# Patient Record
Sex: Female | Born: 1960 | Race: White | Hispanic: No | Marital: Married | Smoking: Current every day smoker
Health system: Southern US, Community
[De-identification: ages and names within clinical notes are randomized; demographics above are authoritative.]

## PROBLEM LIST (undated history)

## (undated) DIAGNOSIS — K219 Gastro-esophageal reflux disease without esophagitis: Secondary | ICD-10-CM

## (undated) DIAGNOSIS — M1990 Unspecified osteoarthritis, unspecified site: Secondary | ICD-10-CM

## (undated) DIAGNOSIS — F419 Anxiety disorder, unspecified: Secondary | ICD-10-CM

## (undated) DIAGNOSIS — C801 Malignant (primary) neoplasm, unspecified: Secondary | ICD-10-CM

## (undated) DIAGNOSIS — M549 Dorsalgia, unspecified: Secondary | ICD-10-CM

## (undated) DIAGNOSIS — F32A Depression, unspecified: Secondary | ICD-10-CM

## (undated) DIAGNOSIS — G8929 Other chronic pain: Secondary | ICD-10-CM

## (undated) DIAGNOSIS — R0600 Dyspnea, unspecified: Secondary | ICD-10-CM

## (undated) DIAGNOSIS — F329 Major depressive disorder, single episode, unspecified: Secondary | ICD-10-CM

## (undated) DIAGNOSIS — J449 Chronic obstructive pulmonary disease, unspecified: Secondary | ICD-10-CM

## (undated) DIAGNOSIS — F1110 Opioid abuse, uncomplicated: Secondary | ICD-10-CM

## (undated) HISTORY — PX: OTHER SURGICAL HISTORY: SHX169

## (undated) HISTORY — PX: OVARIAN CYST REMOVAL: SHX89

## (undated) HISTORY — PX: ABDOMINAL HYSTERECTOMY: SHX81

## (undated) HISTORY — PX: CHOLECYSTECTOMY: SHX55

## (undated) HISTORY — PX: CARPAL TUNNEL RELEASE: SHX101

## (undated) HISTORY — PX: CYST REMOVAL HAND: SHX6279

---

## 2001-11-20 ENCOUNTER — Other Ambulatory Visit: Admission: RE | Admit: 2001-11-20 | Discharge: 2001-11-20 | Payer: Self-pay | Admitting: Obstetrics and Gynecology

## 2003-01-28 ENCOUNTER — Encounter: Payer: Self-pay | Admitting: Internal Medicine

## 2003-01-28 ENCOUNTER — Encounter (HOSPITAL_COMMUNITY): Admission: RE | Admit: 2003-01-28 | Discharge: 2003-02-27 | Payer: Self-pay | Admitting: Internal Medicine

## 2004-10-22 ENCOUNTER — Ambulatory Visit: Payer: Self-pay | Admitting: Family Medicine

## 2005-03-12 ENCOUNTER — Ambulatory Visit: Payer: Self-pay | Admitting: Family Medicine

## 2005-07-02 ENCOUNTER — Ambulatory Visit: Payer: Self-pay | Admitting: Family Medicine

## 2005-07-23 ENCOUNTER — Ambulatory Visit: Payer: Self-pay | Admitting: Family Medicine

## 2005-10-28 ENCOUNTER — Ambulatory Visit: Payer: Self-pay | Admitting: Family Medicine

## 2005-11-04 ENCOUNTER — Ambulatory Visit: Payer: Self-pay | Admitting: Family Medicine

## 2006-02-24 ENCOUNTER — Ambulatory Visit: Payer: Self-pay | Admitting: Family Medicine

## 2006-03-19 ENCOUNTER — Ambulatory Visit: Payer: Self-pay | Admitting: Family Medicine

## 2006-07-11 ENCOUNTER — Ambulatory Visit (HOSPITAL_COMMUNITY): Admission: RE | Admit: 2006-07-11 | Discharge: 2006-07-11 | Payer: Self-pay | Admitting: Obstetrics and Gynecology

## 2006-07-23 ENCOUNTER — Inpatient Hospital Stay (HOSPITAL_COMMUNITY): Admission: AD | Admit: 2006-07-23 | Discharge: 2006-07-23 | Payer: Self-pay | Admitting: Obstetrics and Gynecology

## 2006-08-07 ENCOUNTER — Ambulatory Visit (HOSPITAL_COMMUNITY): Admission: RE | Admit: 2006-08-07 | Discharge: 2006-08-08 | Payer: Self-pay | Admitting: Obstetrics and Gynecology

## 2006-08-07 ENCOUNTER — Encounter (INDEPENDENT_AMBULATORY_CARE_PROVIDER_SITE_OTHER): Payer: Self-pay | Admitting: *Deleted

## 2009-08-17 ENCOUNTER — Ambulatory Visit: Payer: Self-pay | Admitting: Emergency Medicine

## 2009-08-17 DIAGNOSIS — J449 Chronic obstructive pulmonary disease, unspecified: Secondary | ICD-10-CM | POA: Insufficient documentation

## 2009-08-17 DIAGNOSIS — I10 Essential (primary) hypertension: Secondary | ICD-10-CM

## 2009-08-17 DIAGNOSIS — R05 Cough: Secondary | ICD-10-CM

## 2009-08-17 DIAGNOSIS — J309 Allergic rhinitis, unspecified: Secondary | ICD-10-CM | POA: Insufficient documentation

## 2009-09-04 ENCOUNTER — Telehealth (INDEPENDENT_AMBULATORY_CARE_PROVIDER_SITE_OTHER): Payer: Self-pay | Admitting: *Deleted

## 2009-09-07 ENCOUNTER — Ambulatory Visit: Payer: Self-pay | Admitting: Emergency Medicine

## 2009-10-10 ENCOUNTER — Ambulatory Visit: Payer: Self-pay | Admitting: Emergency Medicine

## 2009-10-11 ENCOUNTER — Telehealth (INDEPENDENT_AMBULATORY_CARE_PROVIDER_SITE_OTHER): Payer: Self-pay | Admitting: *Deleted

## 2009-10-11 ENCOUNTER — Telehealth: Payer: Self-pay | Admitting: Emergency Medicine

## 2009-11-07 ENCOUNTER — Telehealth: Payer: Self-pay | Admitting: Emergency Medicine

## 2010-07-17 NOTE — Assessment & Plan Note (Signed)
Summary: COPD, cough   Visit Type:  Follow-up Copy to:  Prudy Feeler, PA Primary Provider/Referring Provider:  Samuel Jester, DO  CC:  PFT follow up and pt had no complaints today.  History of Present Illness: 50 yo smoker, hx allergic rhinitis and HTN. Referred for COPD. Has been on Advair for 2 -3 yrs for chronic bronchitis per her PCP. Has had flares before, has been treated with pred and abx before. CXR with hyperinflation.   Developed URI symptoms a month, cough prod of greeninsh phlegm, coughs all day, worse at night. Has been rx with Prednisone, amox, doxycycline. Some pleuritic CP, occas wheeze. Still with nasal congestion. Using ventolin frequently.   ROV 09/07/09 -- returns for f/u. Saw her last time for COPD (on Advair), chronic cough. Continues to cough frequently, especially at night. Productive of yellowish phlegm. She is doing nasal saline washes. Tried benadryl temporarily. On nexium with very little breakthrough GERD. Still smoking 1 pk/day.   ROV 10/10/09 -- follows up for her tobacco use, COPD, chronic cough. She stopped lisinopril for 10 days, but restarted because her BP was poorly controlled on the benicar. Also continued the [place] and fluticisone spray. On Nexium once daily. Has been on Advair.   Current Medications (verified): 1)  Lisinopril 10 Mg Tabs (Lisinopril) .Marland Kitchen.. 1 Once Daily 2)  Advair Diskus 250-50 Mcg/dose Aepb (Fluticasone-Salmeterol) .Marland Kitchen.. 1 Two Times A Day 3)  Proventil Hfa 108 (90 Base) Mcg/act Aers (Albuterol Sulfate) .... 2 Puffs Every 4 Hrs As Needed 4)  Eemt 1.25-2.5 Mg Tabs (Est Estrogens-Methyltest) .Marland Kitchen.. 1 Once Daily 5)  Hydrocodone-Acetaminophen 10-325 Mg Tabs (Hydrocodone-Acetaminophen) .Marland Kitchen.. 1 Every 6 Hrs As Needed 6)  Carisoprodol 350 Mg Tabs (Carisoprodol) .Marland Kitchen.. 1 Every 6 Hrs As Needed 7)  Nexium 40 Mg Cpdr (Esomeprazole Magnesium) .Marland Kitchen.. 1 Once Daily 8)  Tussionex Pennkinetic Er 8-10 Mg/47ml Lqcr (Chlorpheniramine-Hydrocodone) .Marland Kitchen.. 1 Tsp Every 12  Hours As Needed For Cough 9)  Loratadine 10 Mg Tabs (Loratadine) .Marland Kitchen.. 1 By Mouth Once Daily 10)  Fluticasone Propionate 50 Mcg/act Susp (Fluticasone Propionate) .... 2 Sprays Each Nostril Two Times A Day  Allergies (verified): No Known Drug Allergies  Vital Signs:  Patient profile:   50 year old female Height:      65 inches (165.10 cm) Weight:      141 pounds (64.09 kg) BMI:     23.55 O2 Sat:      96 % on Room air Temp:     97.7 degrees F (36.50 degrees C) oral Pulse rate:   79 / minute BP sitting:   108 / 62  (right arm) Cuff size:   regular  Vitals Entered By: Carver Fila SMA (October 10, 2009 3:52 PM)  O2 Flow:  Room air CC: PFT follow up, pt had no complaints today Comments Meds and allergies updated   Physical Exam  General:  well developed, well nourished, nasal voice with significant congestion Head:  normocephalic and atraumatic Eyes:  conjunctiva and sclera clear Nose:  no deformity, erythema + clear drainage Mouth:  post pharynx clear Neck:  no masses, thyromegaly, or abnormal cervical nodes, no stridor Chest Wall:  no deformities noted Lungs:  clear bilaterally to auscultation and percussion, no wheeze on forced exp Heart:  regular rate and rhythm, S1, S2 without murmurs, rubs, gallops, or clicks Abdomen:  not examined Msk:  no deformity or scoliosis noted with normal posture Extremities:  no clubbing, cyanosis, edema, or deformity noted Neurologic:  non-focal Skin:  intact without  lesions or rashes Psych:  alert and cooperative; normal mood and affect; normal attention span and concentration   Pulmonary Function Test Date: 10/10/2009 Height (in.): 65 Gender: Female  Pre-Spirometry FVC    Value: 3.83 L/min   Pred: 3.47 L/min     % Pred: 110 % FEV1    Value: 2.23 L     Pred: 2.65 L     % Pred: 84 % FEV1/FVC  Value: 76 %     Pred: 58 %    FEF 25-75  Value: 0.91 L/min   Pred: 3.01 L/min     % Pred: 30 %  Post-Spirometry FVC    Value: 3.78 L/min   Pred:  3.47 L/min     % Pred: 109 % FEV1    Value: 2.33 L     Pred: 2.65 L     % Pred: 88 % FEV1/FVC  Value: 62 %     Pred: 76 %    FEF 25-75  Value: 1.03 L/min   Pred: 3.01 L/min     % Pred: 34 %  Lung Volumes TLC    Value: 5.49 L   % Pred: 106 % RV    Value: 1.66 L   % Pred: 91 % DLCO    Value: 10.0 %   % Pred: 47 % DLCO/VA  Value: 2.12 %   % Pred: 53 %  Comments: mild AFL, no BD response. normal volumes. decreased diffusion capacity. RSB  Impression & Recommendations:  Problem # 1:  COPD (ICD-496) - spiriva - d./c advair  Problem # 2:  COUGH (ICD-786.2)  Treating allergies, GERD - try stopping lisinopril again, start higher dose ARB (losartan 50 two times a day) - try changing advair to spiriva - tussionex - smoking cessation  Orders: Est. Patient Level IV ([address]) Prescription Created Electronically [phone])  Medications Added to Medication List This Visit: 1)  Losartan Potassium 50 Mg Tabs (Losartan potassium) .Marland Kitchen.. 1 by mouth two times a day 2)  Spiriva Handihaler 18 Mcg Caps (Tiotropium bromide monohydrate) .Marland Kitchen.. 1 inhalation once daily 3)  Tussionex Pennkinetic Er 8-10 Mg/28ml Lqcr (Chlorpheniramine-hydrocodone) .... 5cc by mouth q12h as needed cough  Patient Instructions: 1)  Continue your nasal saline washes once daily  2)  Stop lisinopril again 3)  Start losartan 50mg  by mouth two times a day  4)  You need to stop smoking! 5)  Continue your nexium 6)  Continue your fluticisone nasal spray 7)  Use your proventil as needed.  8)  Stop Advair 9)  Start Spiriva 1 inhalation once daily  10)  Follow up with Dr Delton Coombes in 1 month or as needed.  Prescriptions: Sandria Senter ER 8-10 MG/5ML LQCR (CHLORPHENIRAMINE-HYDROCODONE) 5cc by mouth q12h as needed cough  #8 oz x 0   Entered and Authorized by:   Leslye Peer MD   Signed by:   Leslye Peer MD on 10/10/2009   Method used:   Print then Give to Patient   RxID:   [id number] SPIRIVA HANDIHALER 18 MCG CAPS  (TIOTROPIUM BROMIDE MONOHYDRATE) 1 inhalation once daily  #30 x 4   Entered and Authorized by:   Leslye Peer MD   Signed by:   Leslye Peer MD on 10/10/2009   Method used:   Electronically to        [facility] [address] [facility] (retail)       [address]       [place], [place]  [address]       Ph: [phone]       Fax: [phone]   RxID:   [id number] WUXLKGMW POTASSIUM 50 MG TABS (LOSARTAN POTASSIUM) 1 by mouth two times a day  #60 x 4   Entered and Authorized by:   Leslye Peer MD   Signed by:   Leslye Peer MD on 10/10/2009   Method used:   Electronically to        [facility] [address] [facility] (retail)       [address]       [place], [place]  [address]       Ph: [phone]       Fax: [phone]   RxID:   [id number]

## 2010-07-17 NOTE — Progress Notes (Signed)
Summary: prescript  Phone Note From Pharmacy Call back at [phone]   Caller: [facility]  [address] [address] Call For: byrum  Summary of Call: questiona about tussinex prescript  Pharmacy called again pt there Melanie Santiago  October 11, 2009 11:55 AM  Initial call taken by: Rickard Patience,  October 11, 2009 11:46 AM  Follow-up for Phone Call        called pharmacy and spoke to [name] and she states she was nto the pharmacists on this AM adn I need to speak to Florentina Addison who is on Lunch. Sh easked me to call back in 45 minutes. Carron Curie CMA  October 11, 2009 1:04 PM   Additional Follow-up for Phone Call Additional follow up Details #1::        Called spoke with pharmacist Katie.  She states that pt had last filled tussionex on 4/20 for a 12 day supply.  Pharmacist states that pt is requesting early refill.  She states that the earliest they can fill this would be the 30th and this is 2 days early.  Pt states that RB told her that he would be fine with this.  Katie wants to make you aware that she has had problems in the past with pt as far as getting narcs filled at different pharmacies and also trying to get refills early alot.  She gets percocet monthly like clock work per pharmacist.  Pls advise if okay to refill med early, thanks Additional Follow-up by: Vernie Murders,  October 11, 2009 2:14 PM    Additional Follow-up for Phone Call Additional follow up Details #2::    Please call and let her know that I wasn't aware that we were filling early. I don't want her to get the med early, she should wait until the refill was originally planned on the 30th. Leslye Peer MD  October 11, 2009 2:17 PM   Dr Delton Coombes, the 30th will still be 2 days early.  The 12 day supply ends on the 1st of May so really she the 2nd is the day for her to have filled without being early.  Pls advise, thanks Vernie Murders  October 11, 2009 2:22 PM  Then we should do May 2 instead. I don't want to fill early Leslye Peer MD   October 11, 2009 2:43 PM       Additional Follow-up for Phone Call Additional follow up Details #3:: Details for Additional Follow-up Action Taken: Spoke with Florentina Addison and made aware that pt can not med filled until it is due on 5/2.  Also spoke with pt's spouse and explained this to him.  Spouse verbalized understanding. Additional Follow-up by: Vernie Murders,  October 11, 2009 2:48 PM

## 2010-07-17 NOTE — Progress Notes (Signed)
Summary: rx  Phone Note Call from Patient Call back at [phone]   Caller: Patient Call For: Shiri Hodapp Reason for Call: Refill Medication, Talk to Nurse Summary of Call: can she get a refill on her cough med.  Going out of town Friday. Initial call taken by: Eugene Gavia,  Nov 07, 2009 2:15 PM  Follow-up for Phone Call        Spoke with pt.  She is requesting a refill on tussionex because she is going out of town this wkend and will be gone for 1 wk.  She states that the cough is better, but still bothers her.  She states that cough is prod with green sputum.  She is still smoking but trying to quit. She had tussionex refilled on 10/16/09.  Pt aware RB not back in the office until 5/25 am.  Please advise, thanks! Follow-up by: Vernie Murders,  Nov 07, 2009 2:55 PM  Additional Follow-up for Phone Call Additional follow up Details #1::        It is too early to refill the tussionex, likely should not continue indefinitely anyway especially since she is still smoking. I would like for her try delsym 10cc q12h as needed for cough.  Additional Follow-up by: Leslye Peer MD,  Nov 08, 2009 8:56 AM    Additional Follow-up for Phone Call Additional follow up Details #2::    Pt advised. Carron Curie CMA  Nov 08, 2009 9:26 AM

## 2010-07-17 NOTE — Assessment & Plan Note (Signed)
Summary: URI, COPD, cough   Visit Type:  Initial Consult Primary Provider/Referring Provider:  Samuel Jester, DO  CC:   Pulmonary Consult c/o prod cough green increase sob  and unable to sleep x 3 weeks pt is on third antibiotic.  History of Present Illness: 50 yo smoker, hx allergic rhinitis and HTN. Referred for COPD. Has been on Advair for 2 -3 yrs for chronic bronchitis per her PCP. Has had flares before, has been treated with pred and abx before. CXR with hyperinflation.   Developed URI symptoms a month, cough prod of greeninsh phlegm, coughs all day, worse at night. Has been rx with Prednisone, amox, doxycycline. Some pleuritic CP, occas wheeze. Still with nasal congestion. Using ventolin frequently.   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  Vital Signs:  Patient profile:   50 year old female Height:      65 inches Weight:      131.8 pounds BMI:     22.01 O2 Sat:      96 % on Room air Temp:     98.1 degrees F oral Pulse rate:   85 / minute BP sitting:   140 / 84  (left arm)  Vitals Entered By: Renold Genta RCP, LPN (August 17, 1608 10:35 AM)  O2 Sat at Rest %:  96& O2 Flow:  Room air CC:  Pulmonary Consult c/o prod cough green increase sob , unable to sleep x 3 weeks pt is on third antibiotic Comments Medications reviewed with patient Renold Genta RCP, LPN  August 18, 9602 10:57 AM    Physical Exam  General:  well developed, well nourished, nasal voice with significant congestion Head:  normocephalic and atraumatic Eyes:  conjunctiva and sclera clear Nose:  no deformity, erythema + clear drainage Mouth:  post pharynx clear Neck:  no masses, thyromegaly, or abnormal cervical nodes, no  stridor Chest Wall:  no deformities noted Lungs:  clear bilaterally to auscultation and percussion, no wheeze on forced exp Heart:  regular rate and rhythm, S1, S2 without murmurs, rubs, gallops, or clicks Abdomen:  not examined Msk:  no deformity or scoliosis noted with normal posture Extremities:  no clubbing, cyanosis, edema, or deformity noted Neurologic:  non-focal Skin:  intact without lesions or rashes Psych:  alert and cooperative; normal mood and affect; normal attention span and concentration   Impression & Recommendations:  Problem # 1:  COUGH (ICD-786.2)  Suspect this is due to persistant PND, either because her viral process has not cleared +/- allergies. Slow clearance may also be due to her underlying COPD and continued smoking 1 pk/day, although her acute exacerbation (if she had one) has cleared. Need to treat symptoms, manage PND to control cough - start NSWs every day - OTC decongestants - restart allergy regimen: she wants something for sleep, so will have he ruse benadry at bedtime, should help with both issues - tussionex as needed  - solidify allergy regimen in the future after this episode resolves  Orders: Consultation Level IV ([address])  Problem # 2:  COPD (ICD-496) Almost certainly has underlying COPD, not sure there was an exacerbation. If so, it has been successfully treated with Pred + abx.  - continue Advair for now, may decide to adjust BDs in the  future - full PFT when this illness resolves - will check a-1 AT level at a future visit (age 69)  Medications Added to Medication List This Visit: 1)  Lisinopril 10 Mg Tabs (Lisinopril) .Marland Kitchen.. 1 once daily 2)  Advair Diskus 250-50 Mcg/dose Aepb (Fluticasone-salmeterol) .Marland Kitchen.. 1 two times a day 3)  Proventil Hfa 108 (90 Base) Mcg/act Aers (Albuterol sulfate) .... 2 puffs every 4 hrs as needed 4)  Eemt 1.25-2.5 Mg Tabs (Est estrogens-methyltest) .Marland Kitchen.. 1 once daily 5)  Hydrocodone-acetaminophen 10-325 Mg Tabs  (Hydrocodone-acetaminophen) .Marland Kitchen.. 1 every 6 hrs as needed 6)  Doxycycline Hyclate 100 Mg Tabs (Doxycycline hyclate) .Marland Kitchen.. 1 two times a day 7)  Carisoprodol 350 Mg Tabs (Carisoprodol) .Marland Kitchen.. 1 every 6 hrs as needed 8)  Promethazine Vc/codeine 6.25-5-10 Mg/4ml Syrp (Phenyleph-promethazine-cod) .Marland Kitchen.. 1 tsp every 6 hrs as needed 9)  Nexium 40 Mg Cpdr (Esomeprazole magnesium) .Marland Kitchen.. 1 once daily 10)  Tussionex Pennkinetic Er 8-10 Mg/49ml Lqcr (Chlorpheniramine-hydrocodone) .... 5cc by mouth q12h as needed for cough  Patient Instructions: 1)  Start doing your nasal saline washes every day until our next visit. 2)  Use over-the-counter decongestants that contain either chlorphenerimine or bromphenerimine as directed. 3)  Take benadryl 25mg  by mouth at bedtime  4)  Stop your current cough medication and use tussionex up to every 12 hours if needed for cough 5)  Continue your Advair two times a day 6)  Use Ventolin 2 puffs up to every 4 hours if needed for shortness of breath. 7)  Follow up with Dr Delton Coombes in 2 -3 weeks to review your progress.  Prescriptions: Sandria Senter ER 8-10 MG/5ML LQCR (CHLORPHENIRAMINE-HYDROCODONE) 5cc by mouth q12h as needed for cough  #4 oz x 0   Entered and Authorized by:   Leslye Peer MD   Signed by:   Leslye Peer MD on 08/17/2009   Method used:   Print then Give to Patient   RxID:   [id number]

## 2010-07-17 NOTE — Progress Notes (Signed)
Summary: rx  Phone Note Call from Patient Call back at [phone]   Caller: Patient Call For: Karmelo Bass Reason for Call: Talk to Nurse Summary of Call: pharmacy won't fill cough syrup due to wording...do you want to re-write? Initial call taken by: Eugene Gavia,  October 11, 2009 9:21 AM  Follow-up for Phone Call        spoke with pt and she states she took rx to pharmacy and they stated it was too early to fill the rx because she just had a 4oz bottle filled last month and they are telling her this refill is too early per insurance. She states she has the rx in hand and is going to take it to [facility]. I advised her to tell them to call us if they have a problem filling the med. Carron Curie CMA  October 11, 2009 9:37 AM

## 2010-07-17 NOTE — Miscellaneous (Signed)
Summary: Orders Update pft charges  Clinical Lists Changes  Orders: Added new Service order of Carbon Monoxide diffusing w/capacity (94720) - Signed Added new Service order of Lung Volumes (94240) - Signed Added new Service order of Spirometry (Pre & Post) (94060) - Signed 

## 2010-07-17 NOTE — Progress Notes (Signed)
Summary: cough med  Phone Note Call from Patient   Caller: Spouse Call For: byrum Summary of Call: calling for cough med need called to pharmacy [facility] Initial call taken by: Rickard Patience,  October 11, 2009 2:12 PM  Follow-up for Phone Call        Called and spoke with pt's spouse and made aware that we already received a call from pharmacist regarding this issue and we are awaiting response from RB.  Spouse verbalized understanding. Follow-up by: Vernie Murders,  October 11, 2009 2:19 PM

## 2010-07-17 NOTE — Assessment & Plan Note (Signed)
Summary: COPD, cough   Visit Type:  Follow-up Copy to:  Prudy Feeler, PA Primary Provider/Referring Provider:  Samuel Jester, DO  CC:  COPD and cough follow-up.  the patient c/o cough with yellow mucus but says it is better. Still having sob.Marland Kitchen  History of Present Illness: 50 yo smoker, hx allergic rhinitis and HTN. Referred for COPD. Has been on Advair for 2 -3 yrs for chronic bronchitis per her PCP. Has had flares before, has been treated with pred and abx before. CXR with hyperinflation.   Developed URI symptoms a month, cough prod of greeninsh phlegm, coughs all day, worse at night. Has been rx with Prednisone, amox, doxycycline. Some pleuritic CP, occas wheeze. Still with nasal congestion. Using ventolin frequently.   ROV 09/07/09 -- returns for f/u. Saw her last time for COPD (on Advair), chronic cough. Continues to cough frequently, especially at night. Productive of yellowish phlegm. She is doing nasal saline washes. Tried benadryl temporarily. On nexium with very little breakthrough GERD. Still smoking 1 pk/day.   Current Medications (verified): 1)  Lisinopril 10 Mg Tabs (Lisinopril) .Marland Kitchen.. 1 Once Daily 2)  Advair Diskus 250-50 Mcg/dose Aepb (Fluticasone-Salmeterol) .Marland Kitchen.. 1 Two Times A Day 3)  Proventil Hfa 108 (90 Base) Mcg/act Aers (Albuterol Sulfate) .... 2 Puffs Every 4 Hrs As Needed 4)  Eemt 1.25-2.5 Mg Tabs (Est Estrogens-Methyltest) .Marland Kitchen.. 1 Once Daily 5)  Hydrocodone-Acetaminophen 10-325 Mg Tabs (Hydrocodone-Acetaminophen) .Marland Kitchen.. 1 Every 6 Hrs As Needed 6)  Carisoprodol 350 Mg Tabs (Carisoprodol) .Marland Kitchen.. 1 Every 6 Hrs As Needed 7)  Nexium 40 Mg Cpdr (Esomeprazole Magnesium) .Marland Kitchen.. 1 Once Daily 8)  Tussionex Pennkinetic Er 8-10 Mg/68ml Lqcr (Chlorpheniramine-Hydrocodone) .Marland Kitchen.. 1 Tsp Every 12 Hours As Needed For Cough  Allergies (verified): No Known Drug Allergies  Vital Signs:  Patient profile:   50 year old female Height:      65 inches (165.10 cm) Weight:      136 pounds (61.82  kg) BMI:     22.71 O2 Sat:      98 % on Room air Temp:     98.0 degrees F (36.67 degrees C) oral Pulse rate:   77 / minute BP sitting:   116 / 78  (right arm) Cuff size:   regular  Vitals Entered By: Michel Bickers CMA (September 07, 2009 1:25 PM)  O2 Sat at Rest %:  98 O2 Flow:  Room air CC: COPD and cough follow-up.  the patient c/o cough with yellow mucus but says it is better. Still having sob. Comments Medications reviewed with the patient.Michel Bickers CMA  September 07, 2009 1:36 PM   Physical Exam  General:  well developed, well nourished, nasal voice with significant congestion Head:  normocephalic and atraumatic Eyes:  conjunctiva and sclera clear Nose:  no deformity, erythema + clear drainage Mouth:  post pharynx clear Neck:  no masses, thyromegaly, or abnormal cervical nodes, no stridor Chest Wall:  no deformities noted Lungs:  clear bilaterally to auscultation and percussion, no wheeze on forced exp Heart:  regular rate and rhythm, S1, S2 without murmurs, rubs, gallops, or clicks Abdomen:  not examined Msk:  no deformity or scoliosis noted with normal posture Extremities:  no clubbing, cyanosis, edema, or deformity noted Neurologic:  non-focal Skin:  intact without lesions or rashes Psych:  alert and cooperative; normal mood and affect; normal attention span and concentration   Impression & Recommendations:  Problem # 1:  COUGH (ICD-786.2)  Continue your nasal saline washes once daily  Continue your Nexium once daily  Start nasonex 2 sprays each nostril once daily. Once this sample runs out, you have a prescription for fluticisone 2 sprays each nostril two times a day  Stop lisinopril temporarily Start Benicar 20mg  by mouth once daily until the samples run out. If they run out before your follow up visit then go back on your lisinopril.  You may use OTC decongestants that contain the ingredients chlorphenerimine or bromphenerimine.  Follow up with Dr Delton Coombes in 1 month with  full PFT's same day  Orders: Est. Patient Level IV ([address])  Problem # 2:  COPD (ICD-496) Continue your Advair two times a day  Follow up with Dr Delton Coombes in 1 month with full PFT's same day  Medications Added to Medication List This Visit: 1)  Loratadine 10 Mg Tabs (Loratadine) .Marland Kitchen.. 1 by mouth once daily 2)  Fluticasone Propionate 50 Mcg/act Susp (Fluticasone propionate) .... 2 sprays each nostril two times a day 3)  Tussionex Pennkinetic Er 8-10 Mg/74ml Lqcr (Chlorpheniramine-hydrocodone) .... 5cc by mouth q12h as needed cough  Patient Instructions: 1)  Continue your nasal saline washes once daily  2)  Continue your Advair two times a day  3)  Continue your Nexium once daily  4)  Start nasonex 2 sprays each nostril once daily. Once this sample runs out, you have a prescription for fluticisone 2 sprays each nostril two times a day  5)  Stop lisinopril temporarily 6)  Start Benicar 20mg  by mouth once daily until the samples run out. If they run out before your follow up visit then go back on your lisinopril.  7)  You may use OTC decongestants that contain the ingredients chlorphenerimine or bromphenerimine.  8)  Follow up with Dr Delton Coombes in 1 month with full PFT's same day Prescriptions: TUSSIONEX PENNKINETIC ER 8-10 MG/5ML LQCR (CHLORPHENIRAMINE-HYDROCODONE) 5cc by mouth q12h as needed cough  #4 oz x 0   Entered and Authorized by:   Leslye Peer MD   Signed by:   Leslye Peer MD on 09/07/2009   Method used:   Print then Give to Patient   RxID:   [id number] FLUTICASONE PROPIONATE 50 MCG/ACT SUSP (FLUTICASONE PROPIONATE) 2 sprays each nostril two times a day  #1 x 5   Entered and Authorized by:   Leslye Peer MD   Signed by:   Leslye Peer MD on 09/07/2009   Method used:   Electronically to        [facility] [address] [facility] ([facility]ail)       [address]       [place], [place]  [address]       Ph: [phone]       Fax: [phone]   RxID:    [id number] LORATADINE 10 MG TABS (LORATADINE) 1 by mouth once daily  #30 x 5   Entered and Authorized by:   Leslye Peer MD   Signed by:   Leslye Peer MD on 09/07/2009   Method used:   Electronically to        [facility] [address] [facility] ([facility]ail)       [address]       [place], [place]  [address]       Ph: [phone]       Fax: [phone]   RxID:   [id number]

## 2010-07-17 NOTE — Progress Notes (Signed)
Summary: refill cough syrup requested for cough  Phone Note Call from Patient   Caller: Spouse Call For: byrum Summary of Call: pt's spouse says pt is still coughing and request refill of tussionex. call lee Borgen at [phone] (says pt is at work) [employer] Initial call taken by: Tivis Ringer, CNA,  September 04, 2009 10:39 AM  Follow-up for Phone Call        Pt's spouse states the pt was up all night coughing. She is still doing the nasal rinses daily and is scheduled for f/u with RB on Thurs. 09/07/2009. RB is out of the office today. MR is doc of the morning and will send to him for okay to refill Tussinex.  Pharmacy is [facility] in [place]. 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 Do they want to be seen urgentlytoday? I would recommend that first. If not, ok for 2 days of tussionex till they see RB  Additional Follow-up by: Kalman Shan MD,  September 04, 2009 1:00 PM    Additional Follow-up for Phone Call Additional follow up Details #2::    Spoke with pt's spouse Nedra Hai.  He states that pt is unable to come in for appt today because she is working.  Advised that we will call in enough tussionex to last until her ov with RB on 3/24.  [facility] [place].  New/Updated Medications: TUSSIONEX PENNKINETIC ER 8-10 MG/5ML LQCR (CHLORPHENIRAMINE-HYDROCODONE) 1 tsp every 12 hours as needed for cough Prescriptions: TUSSIONEX PENNKINETIC ER 8-10 MG/5ML LQCR (CHLORPHENIRAMINE-HYDROCODONE) 1 tsp every 12 hours as needed for cough  #qs x 0   Entered by:   Vernie Murders   Authorized by:   Kalman Shan MD   Signed by:   Vernie Murders on 09/04/2009   Method used:   Telephoned to ...       [facility] [facility] (retail)       [address]       [place], [place]  [address]       Ph: [phone]       Fax: [phone]   RxID:    [id number]

## 2010-11-02 NOTE — Discharge Summary (Signed)
NAME:  Melanie Santiago, Melanie Santiago                ACCOUNT NO.:  [id number]   MEDICAL RECORD NO.:  [id number]          PATIENT TYPE:  OIB   LOCATION:  9304                          [facility]ILITY:  [facility]   PHYSICIAN:  Duke Salvia. Marcelle Overlie, M.D.DATE OF BIRTH:  1961-04-23   DATE OF ADMISSION:  08/07/2006  DATE OF DISCHARGE:  08/08/2006                               DISCHARGE SUMMARY   DISCHARGE DIAGNOSES:  1. Chronic pelvic pain.  2. Ovarian cyst.  3. Leiomyoma.  4. Laparoscopically assisted vaginal hysterectomy/bilateral salpingo-      oophorectomy this admission.   SUMMARY OF HISTORY AND PHYSICAL EXAMINATION:  Please see admission H&P  for details.  Briefly, a 50 year old with known leiomyoma and chronic  pelvic pain presents for definitive hysterectomy and BSO.   HOSPITAL COURSE:  On February 21, under general anesthesia, the patient  underwent LAVH/BSO.  There were some smooth-walled bilateral ovarian  cysts with bilateral adnexal adhesions and a symmetrically enlarged  uterus.  She underwent LAVH/BSO without complication.  On the 1st postop  day, she was voiding without difficulty.  Her catheter been removed the  evening before.  She was afebrile, tolerating a regular diet.  Her  abdominal exam was unremarkable.  The incisions were clean and dry and  she was ready for discharge at that point.   LABORATORY DATA:  Blood type is A positive, antibody screen negative.  CMET on admission normal except for alkaline phosphatase of 30, slightly  low, and potassium of 3.3.  CBC on admission:  WBC 8300 and hemoglobin  11.9.  On February 22, her potassium had corrected with supplementation  to 3.7, calcium 8.0, total protein 4.9, albumin 2.7.  CBC on February  22:  WBC 7900 and hemoglobin 9.8.   DISPOSITION:  The patient is discharged on:  1. Climara 0.1 patch to change once weekly.  2. Vicodin ES, #30, one p.o. q.4-6 h. p.r.n. pain.   FOLLOWUP:  She will return the office in 1 week.   DISCHARGE  INSTRUCTIONS:  Advised to report any incisional redness or  drainage, increased pain or bleeding, or fever over 101.  She was given  specific instructions regarding diet, sex and exercise.   CONDITION:  Good.   ACTIVITY:  Gradually to increase.     Richard M. Marcelle Overlie, M.D.  Electronically Signed    RMH/MEDQ  D:  08/08/2006  T:  08/08/2006  Job:  [id number]

## 2010-11-02 NOTE — Op Note (Signed)
NAME:  Melanie Santiago, Melanie Santiago                ACCOUNT NO.:  [id number]   MEDICAL RECORD NO.:  [id number]          PATIENT TYPE:  AMB   LOCATION:  SDC                           [facility]   PHYSICIAN:  Duke Salvia. Marcelle Overlie, M.D.DATE OF BIRTH:  Jun 02, 1961   DATE OF PROCEDURE:  08/07/2006  DATE OF DISCHARGE:                               OPERATIVE REPORT   PREOPERATIVE DIAGNOSES:  1. Chronic pelvic pain.  2. Leiomyoma.   POSTOPERATIVE DIAGNOSES:  1. Chronic pelvic pain.  2. Leiomyoma.  3. Bilateral ovarian cysts.   PROCEDURE:  LAVH BSO.   SURGEON:  Duke Salvia. Marcelle Overlie, M.D.   ASSISTANT:  Guy Sandifer. Henderson Cloud, M.D.   SPECIMENS REMOVED:  Uterus, bilateral tubes and ovaries.   ESTIMATED BLOOD LOSS:  250.   COMPLICATIONS:  None.   DRAINS:  Foley catheter.   PROCEDURE AND FINDINGS:  The patient taken to the operating room.  After  an adequate level of general endotracheal anesthesia was obtained with  the patient's legs in stirrups, the abdomen, perineum and vagina were  prepped and draped with Betadine.  The bladder was drained with a Foley  catheter.  EUA carried out.  The uterus was 8-9 weeks' size, mobile.  Adnexa negative.  Hulka tenaculum was positioned and attention directed  to the abdomen where a 2 cm subumbilical incision was made after  infiltrating with half strength Marcaine plain. A small incision was  made and the Veress needle was introduced without difficulty.  After 2.5  liter pneumoperitoneum was then created, laparoscopic trocar and sleeve  were then introduced without difficulty.  There was no evidence any  bleeding or trauma.  Three fingerbreadths above the symphysis in  midline, 5 mm trocar was inserted under direct visualization.  The  patient then placed in Trendelenburg and pelvic findings as follows.   The uterus itself was symmetrically large, approximately 9-10 weeks  size, but mobile.  The cul-de-sac was free and clear.  The right ovary  was enlarged twice  normal size with a smooth-walled cyst occupying one  pole.  There were some periadnexal cysts on the left side, but that left  ovary otherwise appeared to be normal.  Starting on the right, the right  tube and ovary were placed on traction toward the midline.  The  infundibulopelvic ligament was observed.  The course of the ureter was  traced out and noted to be well below.  The right IP ligament was  coagulated and divided with the gyrus PK instrument down to and  including the round ligament.  The exact same repeated on the left side  after carefully identifying again the course of the left ureter which  was well below the operative site.  Once the ovaries were freed up, the  vaginal portion procedure started at that point.   The legs were extended, a weighted speculum was positioned.  Cervix  grasped with tenaculum.  Cervicovaginal mucosa was incised with the  Bovie.  Posterior culdotomy performed without difficulty.  The bladder  was advanced superiorly with sharp and blunt dissection.  Once the  anterior peritoneum  was identified, this was entered sharply and a  retractor used to gently elevate the bladder out of the field.  In  sequential manner, staying close the uterus, the uterosacral ligament,  cardinal ligament, uterine vasculature pedicles and upper broad ligament  pedicles were clamped, divided with the gyrus PK instrument.  The fundus  of the uterus was then delivered posteriorly.  Some remaining small  peritoneal pedicles were clamped, divided and free tied with 0 Vicryl  suture.  The cuff was then closed from 3 to 9 o'clock with a running  locked 2-0 Vicryl suture.  Prior to closure, sponge, needle and  instrument counts were reported as correct x2.  The posterior cul-de-sac  was reinforced by a plicating left uterosacral ligament, picking up  posterior peritoneum superficially to the right the uterosacral ligament  by the cuff and tied down for extra posterior support.   The vaginal  tissue was then closed right-to-left with interrupted 2-0 Monocryl  suture.  Prior to closure, sponge, needle and instrument counts were  reported as correct x2.  Foley catheter was repositioned draining clear  urine.  Repeat laparoscopy with the pressure lowered revealed excellent  hemostasis of the operative site.  Instruments were removed, gas allowed  to escape, defect closed with 4-0 Dexon subcuticular sutures and  Dermabond.  She tolerated this well and went to the recovery room in  good condition.      Richard M. Marcelle Overlie, M.D.  Electronically Signed     RMH/MEDQ  D:  08/07/2006  T:  08/07/2006  Job:  [id number]

## 2010-11-02 NOTE — H&P (Signed)
NAME:  Melanie Santiago, Melanie Santiago                ACCOUNT NO.:  [id number]   MEDICAL RECORD NO.:  [id number]          PATIENT TYPE:  AMB   LOCATION:  SDC                           [facility]   PHYSICIAN:  Duke Salvia. Marcelle Overlie, M.D.DATE OF BIRTH:  01/28/1961   DATE OF ADMISSION:  DATE OF DISCHARGE:                              HISTORY & PHYSICAL   DATE OF SURGERY:  August 07, 2006 at [facility].   CHIEF COMPLAINT:  Chronic pelvic pain, right ovarian cyst.   HISTORY OF PRESENT ILLNESS:  The patient is a 50 year old G2, P1 who has  had a prior tubal.  She has also had a prior Pfannenstiel laparotomy for  right ovarian cystectomy and has been evaluated recently with worsening  pelvic pain,  in particular right lower quadrant pain.  She has  postponed hysterectomy several times, but her pain has worsened to the  point that she is now requiring narcotics for even minimal relief.  Recent CT scan to rule out kidney stone for hematuria showed a 3 cm  right ovarian cyst and small leiomyoma.  She presents now for LAVH, BSO,  possible TAH, BSO.  This procedure including risk of bleeding,  infection, transfusion, adjacent organ injury, along with her expected  recovery time, the need for ERT,  possibly other risks related to her  smoking history and pneumonia, wound infection, phlebitis  were all  reviewed with her, which she understands and accepts.   PAST MEDICAL HISTORY:   ALLERGIES:  None.   PAST SURGICAL HISTORY:  Operations include tubal, cholecystectomy,  laparotomy for right ovarian cystectomy.   SOCIAL HISTORY:  Smoking history one pack per day.   FAMILY HISTORY:  Family history is significant for mother who had  unspecified type of cancer.   CLINICAL DATA:  Her most recent ultrasound showed enlarged uterus with  leiomyomata, some with calcifications, with a right ovarian cyst.   PHYSICAL EXAMINATION:  VITAL SIGNS:  Temperature 98.2, blood pressure  120/72.  HEENT: Unremarkable.  NECK:  Supple without masses.  LUNGS:  Clear.  CARDIOVASCULAR:  Regular rate and rhythm without murmurs, rubs or  gallops noted.  BREASTS:  Without masses.  ABDOMEN:  Soft, flat, nontender.  PELVIC:  Normal external genitalia, Vagina and cervix clear.  Uterus 8  weeks size, slightly tender with fullness on the right side difficult to  delineate due to discomfort.  Left side unremarkable.  EXTREMITIES:  Unremarkable.  NEUROLOGICAL:  Neurologically she is unremarkable.   IMPRESSION:  Acute and chronic pelvic pain, most likely secondary to  leiomyoma with right ovarian cyst and probable right ovarian adhesions  with a prior history of right ovarian cystectomy.   PLAN:  LAVH, BSO, possible TAH,BSO.  Procedure and risks reviewed as  above.      Richard M. Marcelle Overlie, M.D.  Electronically Signed     RMH/MEDQ  D:  07/24/2006  T:  07/24/2006  Job:  [id number]

## 2011-06-24 ENCOUNTER — Other Ambulatory Visit: Payer: Self-pay

## 2011-06-28 ENCOUNTER — Emergency Department (HOSPITAL_COMMUNITY)
Admission: EM | Admit: 2011-06-28 | Discharge: 2011-06-28 | Disposition: A | Discharge status: Home / Self Care | Payer: BC Managed Care – PPO | Attending: Emergency Medicine | Admitting: Emergency Medicine

## 2011-06-28 ENCOUNTER — Emergency Department (HOSPITAL_COMMUNITY): Payer: BC Managed Care – PPO

## 2011-06-28 ENCOUNTER — Encounter (HOSPITAL_COMMUNITY): Payer: Self-pay

## 2011-06-28 DIAGNOSIS — I10 Essential (primary) hypertension: Secondary | ICD-10-CM | POA: Insufficient documentation

## 2011-06-28 DIAGNOSIS — Z9089 Acquired absence of other organs: Secondary | ICD-10-CM | POA: Insufficient documentation

## 2011-06-28 DIAGNOSIS — F1120 Opioid dependence, uncomplicated: Secondary | ICD-10-CM | POA: Insufficient documentation

## 2011-06-28 DIAGNOSIS — R05 Cough: Secondary | ICD-10-CM | POA: Insufficient documentation

## 2011-06-28 DIAGNOSIS — F17200 Nicotine dependence, unspecified, uncomplicated: Secondary | ICD-10-CM | POA: Insufficient documentation

## 2011-06-28 DIAGNOSIS — F1110 Opioid abuse, uncomplicated: Secondary | ICD-10-CM

## 2011-06-28 DIAGNOSIS — R059 Cough, unspecified: Secondary | ICD-10-CM | POA: Insufficient documentation

## 2011-06-28 DIAGNOSIS — R0602 Shortness of breath: Secondary | ICD-10-CM | POA: Insufficient documentation

## 2011-06-28 HISTORY — DX: Dorsalgia, unspecified: M54.9

## 2011-06-28 HISTORY — DX: Chronic obstructive pulmonary disease, unspecified: J44.9

## 2011-06-28 HISTORY — DX: Other chronic pain: G89.29

## 2011-06-28 LAB — RAPID URINE DRUG SCREEN, HOSP PERFORMED
Amphetamines: NOT DETECTED
Barbiturates: NOT DETECTED
Tetrahydrocannabinol: NOT DETECTED

## 2011-06-28 LAB — COMPREHENSIVE METABOLIC PANEL
ALT: 21 U/L (ref 0–35)
AST: 21 U/L (ref 0–37)
Albumin: 4.3 g/dL (ref 3.5–5.2)
Calcium: 9.8 mg/dL (ref 8.4–10.5)
Chloride: 101 mEq/L (ref 96–112)
Creatinine, Ser: 0.8 mg/dL (ref 0.50–1.10)
Sodium: 138 mEq/L (ref 135–145)

## 2011-06-28 LAB — CBC
Hemoglobin: 13.5 g/dL (ref 12.0–15.0)
MCH: 33.7 pg (ref 26.0–34.0)
MCHC: 34.6 g/dL (ref 30.0–36.0)
MCV: 97.3 fL (ref 78.0–100.0)
RBC: 4.01 MIL/uL (ref 3.87–5.11)

## 2011-06-28 LAB — ETHANOL: Alcohol, Ethyl (B): <11 mg/dL (ref 0–11)

## 2011-06-28 LAB — ACETAMINOPHEN LEVEL: Acetaminophen (Tylenol), Serum: <15 ug/mL (ref 10–30)

## 2011-06-28 MED ORDER — LORAZEPAM 1 MG PO TABS
1.0000 mg | ORAL_TABLET | Freq: Three times a day (TID) | ORAL | Status: DC | PRN
  Administered 2011-06-28: 1 mg via ORAL
  Filled 2011-06-28: qty 1

## 2011-06-28 MED ORDER — ONDANSETRON HCL 4 MG PO TABS: 4.0000 mg | ORAL_TABLET | Freq: Three times a day (TID) | ORAL | Status: DC | PRN

## 2011-06-28 MED ORDER — IBUPROFEN 800 MG PO TABS
800.0000 mg | ORAL_TABLET | Freq: Three times a day (TID) | ORAL | Status: DC | PRN
  Administered 2011-06-28: 800 mg via ORAL
  Filled 2011-06-28: qty 1

## 2011-06-28 MED ORDER — NICOTINE 21 MG/24HR TD PT24
21.0000 mg | MEDICATED_PATCH | Freq: Every day | TRANSDERMAL | Status: DC
  Administered 2011-06-28: 21 mg via TRANSDERMAL
  Filled 2011-06-28: qty 1

## 2011-06-28 MED ORDER — ACETAMINOPHEN 500 MG PO TABS
1000.0000 mg | ORAL_TABLET | Freq: Four times a day (QID) | ORAL | Status: DC | PRN
  Administered 2011-06-28: 1000 mg via ORAL
  Filled 2011-06-28: qty 2

## 2011-06-28 MED ORDER — GUAIFENESIN 100 MG/5ML PO SOLN
10.0000 mL | Freq: Four times a day (QID) | ORAL | Status: DC | PRN
  Administered 2011-06-28: 200 mg via ORAL
  Filled 2011-06-28: qty 10

## 2011-06-28 MED ORDER — ALBUTEROL SULFATE HFA 108 (90 BASE) MCG/ACT IN AERS: 2.0000 | INHALATION_SPRAY | RESPIRATORY_TRACT | Status: DC | PRN

## 2011-06-28 NOTE — ED Notes (Signed)
Here w/husband.  Wants detox off hydrocodone which she takes for chronic back issues x last 5 years.

## 2011-06-28 NOTE — ED Notes (Signed)
Care assumed

## 2011-06-28 NOTE — ED Notes (Signed)
Vital signs stable. 

## 2011-06-28 NOTE — ED Notes (Signed)
Family at bedside. 

## 2011-06-28 NOTE — ED Notes (Signed)
Patient denies pain and is resting comfortably.  

## 2011-06-28 NOTE — ED Notes (Signed)
Patient transported to X-ray 

## 2011-06-28 NOTE — ED Notes (Signed)
Pt. Belongings were placed in locker 43 with her pt label on the bag. She had one bag in her possession.

## 2011-06-28 NOTE — BH Assessment (Signed)
Assessment Note   Melanie Santiago is an 51 y.o. female. 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 PT EXPRESSED THAT HER ABUSE HAS AFFECTED HER RELATIONSHIP WITH HER FAMILY & CAUSE SOME FAMILY STRAIN. PT DENIES ANY IDEATION & IS ABLE TO CONTRACT FOR SAFETY. PT STATES HE DOES NOT WANT TO LOSE HER FAMILY BECAUSE OF HER DRUG USE. PT ADMITS TO USING 24 PILLS OF 10/500 DAILY X 5 YRS & LAST USE WAS TODAY, 1 PILL. PT SAYS SHE NO HX OF TREATMENT & SPOUSE IS VERY SUPPORTIVE. PT WAS REFERRED [facility] FOR ADMISSION & PENDING DISPOSITION.  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 Back pain, chronic   . Hypertension   . COPD (chronic obstructive pulmonary disease)    Axis IV: economic problems and problems related to social environment Axis V: 41-50 serious symptoms  Past Medical History:  Past Medical History  Diagnosis Date  . Back pain, chronic   . Hypertension   . COPD (chronic obstructive pulmonary disease)     Past Surgical History  Procedure Date  . Abdominal hysterectomy   . Cholecystectomy   . Ovarian cyst removal     Family History: History reviewed. No pertinent family history.  Social History:  reports that she has been smoking Cigarettes.  She has been smoking about 1 pack per day. She does not have any smokeless tobacco history on file. She reports that she does not drink alcohol or use illicit drugs.  Additional Social History:    Allergies: No Known Allergies  Home Medications:  Medications Prior to Admission  Medication Dose Route Frequency Provider Last Rate Last Dose  . acetaminophen (TYLENOL) tablet 1,000 mg  1,000 mg Oral Q6H PRN Nat Christen, MD   1,000 mg at 06/28/11 1800  . albuterol (PROVENTIL HFA;VENTOLIN HFA) 108 (90 BASE) MCG/ACT inhaler 2 puff  2 puff Inhalation Q4H PRN Suzi Roots, MD      . guaiFENesin (ROBITUSSIN) 100 MG/5ML solution 200 mg  10 mL Oral Q6H PRN Nat Christen,  MD      . ibuprofen (ADVIL,MOTRIN) tablet 800 mg  800 mg Oral TID PRN Nat Christen, MD   800 mg at 06/28/11 1957  . LORazepam (ATIVAN) tablet 1 mg  1 mg Oral Q8H PRN Suzi Roots, MD   1 mg at 06/28/11 1716  . nicotine (NICODERM CQ - dosed in mg/24 hours) patch 21 mg  21 mg Transdermal Daily Suzi Roots, MD   21 mg at 06/28/11 1552  . ondansetron (ZOFRAN) tablet 4 mg  4 mg Oral Q8H PRN Suzi Roots, MD       No current outpatient prescriptions on file as of 06/28/2011.    OB/GYN Status:  No LMP recorded. Patient has had a hysterectomy.  General Assessment Data Location of Assessment: [facility] ACT Assessment: Yes Living Arrangements: Spouse/significant other Can pt return to current living arrangement?: Yes Admission Status: Voluntary Is patient capable of signing voluntary admission?: Yes Transfer from: Acute Hospital Referral Source: Self/Family/Friend     Risk to self Suicidal Ideation: No Suicidal Intent: No Is patient at risk for suicide?: No Suicidal Plan?: No Access to Means: No What has been your use of drugs/alcohol within the last 12 months?: OPIOID(HYDROCODONE) AT AGE 64 & HAS BEEN USING 24 PILLS OF 10/500 DAILY & LAST USE WAS 06/28/11 & HAD JUST 1 PILL Previous Attempts/Gestures: No How many times?:  0  Other Self Harm Risks: NA Triggers for Past Attempts: Unpredictable Intentional Self Injurious Behavior: None Family Suicide History: Unknown Recent stressful life event(s): Turmoil (Comment) Persecutory voices/beliefs?: No Depression: Yes Depression Symptoms: Loss of interest in usual pleasures;Guilt;Fatigue;Feeling worthless/self pity Substance abuse history and/or treatment for substance abuse?: Yes Suicide prevention information given to non-admitted patients: Not applicable  Risk to Others Homicidal Ideation: No Thoughts of Harm to Others: No Current Homicidal Intent: No Current Homicidal Plan: No Access to Homicidal Means: No Identified Victim:  NA History of harm to others?: No Assessment of Violence: None Noted Violent Behavior Description: CALM, COOPERATIVE Does patient have access to weapons?: No Criminal Charges Pending?: No Does patient have a court date: No  Psychosis Hallucinations: None noted Delusions: None noted  Mental Status Report Appear/Hygiene: Improved Eye Contact: Good Motor Activity: Unremarkable Speech: Soft;Slow;Logical/coherent Level of Consciousness: Alert Mood: Depressed;Anhedonia;Helpless;Sad Affect: Depressed;Appropriate to circumstance;Sad Anxiety Level: Minimal Thought Processes: Coherent;Relevant Judgement: Unimpaired Orientation: Person;Place;Time;Situation Obsessive Compulsive Thoughts/Behaviors: None  Cognitive Functioning Concentration: Decreased Memory: Recent Intact;Remote Intact IQ: Average Insight: Poor Impulse Control: Poor Appetite: Poor Weight Loss: 0  Weight Gain: 0  Sleep: Decreased Total Hours of Sleep: 4  Vegetative Symptoms: None  Prior Inpatient Therapy Prior Inpatient Therapy: No Prior Therapy Dates: NA Prior Therapy Facilty/Provider(s): NA Reason for Treatment: NA  Prior Outpatient Therapy Prior Outpatient Therapy: No Prior Therapy Dates: NA Prior Therapy Facilty/Provider(s): NA Reason for Treatment: NA                     Additional Information 1:1 In Past 12 Months?: No CIRT Risk: No Elopement Risk: No Does patient have medical clearance?: Yes     Disposition:  Disposition Disposition of Patient: Inpatient treatment program;Referred to [facility]) Type of inpatient treatment program: Adult  On Site Evaluation by:   Reviewed with Physician:     Waldron Session 06/28/2011 10:08 PM

## 2011-06-28 NOTE — ED Notes (Signed)
Walked patient to xray with technicians. While having chest xray this tech noticed patient still had on her bra. Asked pt to remove bra and I locked it up with other belongings in locker #43 after return from xray.

## 2011-06-28 NOTE — ED Notes (Signed)
Pt in room, husband at bedside concerned about having to wait so long to see a MD, pt/husband states pt is only here for detox and they don't understand why they have to wait so long. Writer attempted to educate pt/husband about delay, they both verbalized understanding. Pt c/o pain/medicated.

## 2011-06-28 NOTE — ED Notes (Signed)
Act in to speak with pt 

## 2011-06-28 NOTE — ED Notes (Signed)
Patient is resting comfortably. 

## 2011-06-28 NOTE — ED Provider Notes (Signed)
History     CSN: [id number]  Arrival date & time 06/28/11  1321   First MD Initiated Contact with Patient 06/28/11 1337      Chief Complaint  Patient presents with  . Medical Clearance    wants detox off of hydrocodone    (Consider location/radiation/quality/duration/timing/severity/associated sxs/prior treatment) The history is provided by the patient.  pt states abusing hydrocodone for past months/years. States originally was taking for back pain, but in recent months/years has been taking in over the prescribed amount. Denies single overdose or abrupt or acute increase in amount used. States gets depressed, but only when runs out of hydrocodone. Denies suicidal thoughts. w abuse, denies specific exacerbating or alleviating factors. Also notes recent uri symptoms w non productive cough, nasal congestion. Denies sore throat. No headache. No trouble breathing or swallowing. States hx copd, smoker.   Past Medical History  Diagnosis Date  . Back pain, chronic   . Hypertension   . COPD (chronic obstructive pulmonary disease)     Past Surgical History  Procedure Date  . Abdominal hysterectomy   . Cholecystectomy   . Ovarian cyst removal     History reviewed. No pertinent family history.  History  Substance Use Topics  . Smoking status: Current Everyday Smoker -- 1.0 packs/day    Types: Cigarettes  . Smokeless tobacco: Not on file  . Alcohol Use: No    OB History    Grav Para Term Preterm Abortions TAB SAB Ect Mult Living                  Review of Systems  Constitutional: Negative for fever.  HENT: Negative for neck pain.   Eyes: Negative for redness.  Respiratory: Positive for cough. Negative for shortness of breath.   Cardiovascular: Negative for chest pain.  Gastrointestinal: Negative for abdominal pain.  Genitourinary: Negative for flank pain.  Musculoskeletal: Negative for back pain.  Skin: Negative for rash.  Neurological: Negative for headaches.    Hematological: Does not bruise/bleed easily.  Psychiatric/Behavioral: Negative for agitation.    Allergies  Review of patient's allergies indicates no known allergies.  Home Medications  No current outpatient prescriptions on file.  BP 134/76  Pulse 95  Temp(Src) 98.2 F (36.8 C) (Oral)  Resp 18  SpO2 99%  Physical Exam  Nursing note and vitals reviewed. Constitutional: She is oriented to person, place, and time. She appears well-developed and well-nourished. No distress.  HENT:  Head: Atraumatic.  Mouth/Throat: Oropharynx is clear and moist.  Eyes: Conjunctivae are normal. Pupils are equal, round, and reactive to light. No scleral icterus.  Neck: Neck supple. No tracheal deviation present.  Cardiovascular: Normal rate, regular rhythm, normal heart sounds and intact distal pulses.   Pulmonary/Chest: Effort normal. No respiratory distress.       Non prod cough, mild upper resp congestion  Abdominal: Soft. Normal appearance and bowel sounds are normal. She exhibits no distension. There is no tenderness.  Musculoskeletal: She exhibits no edema and no tenderness.  Neurological: She is alert and oriented to person, place, and time.  Skin: Skin is warm and dry. No rash noted.  Psychiatric: She has a normal mood and affect.    ED Course  Procedures (including critical care time)   Labs Reviewed  COMPREHENSIVE METABOLIC PANEL  CBC  URINE RAPID DRUG SCREEN (HOSP PERFORMED)  ETHANOL  ACETAMINOPHEN LEVEL  SALICYLATE LEVEL   Results for orders placed during the hospital encounter of 06/28/11  URINE RAPID DRUG SCREEN (HOSP  PERFORMED)      Component Value Range   Opiates POSITIVE (*) NONE DETECTED    Cocaine NONE DETECTED  NONE DETECTED    Benzodiazepines NONE DETECTED  NONE DETECTED    Amphetamines NONE DETECTED  NONE DETECTED    Tetrahydrocannabinol NONE DETECTED  NONE DETECTED    Barbiturates NONE DETECTED  NONE DETECTED    Dg Chest 2 View  06/28/2011  *RADIOLOGY  REPORT*  Clinical Data: Cough and shortness of breath.  History of smoking for 34 years (one - two packs a day).  History of COPD.  CHEST - 2 VIEW  Comparison: Chest x-ray 08/06/2006  Findings: Lungs appear hyperexpanded with flattening of the hemidiaphragms, increased retrosternal air space and pruning of the pulmonary vasculature in the periphery, suggestive of underlying COPD.  No pleural effusions.  Bilateral apical pleuroparenchymal thickening similar to priors, most consistent with scarring. Pulmonary vasculature and the cardiomediastinal silhouette are within normal limits. Surgical clips projecting over the right upper quadrant of the abdomen, likely from prior cholecystectomy.  IMPRESSION:  1.  Chronic changes of COPD redemonstrated, without radiographic evidence to suggest acute cardiopulmonary disease. 2.  Status post cholecystectomy.  Original Report Authenticated By: Florencia Reasons, M.D.       MDM  Pt c/o need detox/rehab from chronic prescription opiate abuse.   Labs sent. Will move to psych ed, act assessment called.   Act assessment and labs pending, signed out to Dr Golda Acre to follow up with Act team, check labs when back, and dispo appropriately.       Suzi Roots, MD 06/28/11 [phone]

## 2011-06-29 ENCOUNTER — Inpatient Hospital Stay (HOSPITAL_COMMUNITY)
Admission: AD | Admit: 2011-06-29 | Discharge: 2011-07-02 | DRG: 744 | Disposition: A | Discharge status: Home / Self Care | Payer: BC Managed Care – PPO | Source: Ambulatory Visit | Attending: Psychiatry | Admitting: Psychiatry

## 2011-06-29 ENCOUNTER — Encounter (HOSPITAL_COMMUNITY): Payer: Self-pay | Admitting: *Deleted

## 2011-06-29 DIAGNOSIS — J449 Chronic obstructive pulmonary disease, unspecified: Secondary | ICD-10-CM

## 2011-06-29 DIAGNOSIS — F411 Generalized anxiety disorder: Secondary | ICD-10-CM

## 2011-06-29 DIAGNOSIS — M4120 Other idiopathic scoliosis, site unspecified: Secondary | ICD-10-CM

## 2011-06-29 DIAGNOSIS — IMO0002 Reserved for concepts with insufficient information to code with codable children: Secondary | ICD-10-CM

## 2011-06-29 DIAGNOSIS — Z79899 Other long term (current) drug therapy: Secondary | ICD-10-CM

## 2011-06-29 DIAGNOSIS — M549 Dorsalgia, unspecified: Secondary | ICD-10-CM | POA: Diagnosis present

## 2011-06-29 DIAGNOSIS — I10 Essential (primary) hypertension: Secondary | ICD-10-CM

## 2011-06-29 DIAGNOSIS — J4489 Other specified chronic obstructive pulmonary disease: Secondary | ICD-10-CM

## 2011-06-29 DIAGNOSIS — G8929 Other chronic pain: Secondary | ICD-10-CM

## 2011-06-29 DIAGNOSIS — F17200 Nicotine dependence, unspecified, uncomplicated: Secondary | ICD-10-CM

## 2011-06-29 DIAGNOSIS — F1120 Opioid dependence, uncomplicated: Principal | ICD-10-CM

## 2011-06-29 DIAGNOSIS — F1910 Other psychoactive substance abuse, uncomplicated: Secondary | ICD-10-CM

## 2011-06-29 MED ORDER — HYDROXYZINE HCL 25 MG PO TABS
25.0000 mg | ORAL_TABLET | Freq: Four times a day (QID) | ORAL | Status: DC | PRN
  Administered 2011-06-29 – 2011-06-30 (×3): 25 mg via ORAL

## 2011-06-29 MED ORDER — METHOCARBAMOL 500 MG PO TABS
500.0000 mg | ORAL_TABLET | Freq: Three times a day (TID) | ORAL | Status: DC | PRN
  Administered 2011-06-29 – 2011-07-01 (×5): 500 mg via ORAL
  Filled 2011-06-29 (×5): qty 1

## 2011-06-29 MED ORDER — CLONIDINE HCL 0.1 MG PO TABS: 0.1000 mg | ORAL_TABLET | Freq: Every day | ORAL | Status: DC

## 2011-06-29 MED ORDER — ALBUTEROL SULFATE (5 MG/ML) 0.5% IN NEBU
2.5000 mg | INHALATION_SOLUTION | Freq: Four times a day (QID) | RESPIRATORY_TRACT | Status: DC
  Administered 2011-06-29: 2.5 mg via RESPIRATORY_TRACT
  Filled 2011-06-29 (×8): qty 0.5

## 2011-06-29 MED ORDER — MAGNESIUM HYDROXIDE 400 MG/5ML PO SUSP: 30.0000 mL | Freq: Every day | ORAL | Status: DC | PRN

## 2011-06-29 MED ORDER — EST ESTROGENS-METHYLTEST 1.25-2.5 MG PO TABS
1.0000 | ORAL_TABLET | Freq: Every day | ORAL | Status: DC
  Administered 2011-06-29 – 2011-07-02 (×4): 1 via ORAL
  Filled 2011-06-29 (×5): qty 1

## 2011-06-29 MED ORDER — ZOLPIDEM TARTRATE 10 MG PO TABS
10.0000 mg | ORAL_TABLET | Freq: Every day | ORAL | Status: DC
  Administered 2011-06-29 – 2011-06-30 (×2): 10 mg via ORAL
  Filled 2011-06-29 (×2): qty 1

## 2011-06-29 MED ORDER — GABAPENTIN 400 MG PO CAPS
400.0000 mg | ORAL_CAPSULE | ORAL | Status: DC
  Administered 2011-06-29 – 2011-07-02 (×13): 400 mg via ORAL
  Filled 2011-06-29 (×13): qty 1

## 2011-06-29 MED ORDER — ALBUTEROL SULFATE HFA 108 (90 BASE) MCG/ACT IN AERS
2.0000 | INHALATION_SPRAY | Freq: Four times a day (QID) | RESPIRATORY_TRACT | Status: DC | PRN
  Administered 2011-06-29 – 2011-07-02 (×5): 2 via RESPIRATORY_TRACT
  Filled 2011-06-29: qty 6.7

## 2011-06-29 MED ORDER — TRAZODONE HCL 50 MG PO TABS
50.0000 mg | ORAL_TABLET | Freq: Every evening | ORAL | Status: DC | PRN
  Administered 2011-06-29 – 2011-07-01 (×2): 50 mg via ORAL
  Filled 2011-06-29 (×2): qty 1

## 2011-06-29 MED ORDER — GUAIFENESIN 100 MG/5ML PO SOLN
5.0000 mL | ORAL | Status: DC | PRN
  Administered 2011-06-29 – 2011-06-30 (×3): 100 mg via ORAL
  Administered 2011-07-01: 200 mg via ORAL
  Administered 2011-07-02: 100 mg via ORAL

## 2011-06-29 MED ORDER — ACETAMINOPHEN 325 MG PO TABS: 650.0000 mg | ORAL_TABLET | Freq: Four times a day (QID) | ORAL | Status: DC | PRN

## 2011-06-29 MED ORDER — GABAPENTIN 400 MG PO CAPS: 400.0000 mg | ORAL_CAPSULE | Freq: Four times a day (QID) | ORAL | Status: DC

## 2011-06-29 MED ORDER — PANTOPRAZOLE SODIUM 40 MG PO TBEC
40.0000 mg | DELAYED_RELEASE_TABLET | Freq: Every day | ORAL | Status: DC
  Administered 2011-06-29 – 2011-07-02 (×4): 40 mg via ORAL
  Filled 2011-06-29 (×5): qty 1

## 2011-06-29 MED ORDER — NAPROXEN 500 MG PO TABS
500.0000 mg | ORAL_TABLET | Freq: Two times a day (BID) | ORAL | Status: DC | PRN
  Administered 2011-06-29 – 2011-07-02 (×4): 500 mg via ORAL
  Filled 2011-06-29 (×4): qty 1

## 2011-06-29 MED ORDER — CLONIDINE HCL 0.1 MG PO TABS
0.1000 mg | ORAL_TABLET | Freq: Four times a day (QID) | ORAL | Status: AC
  Administered 2011-06-29 – 2011-07-01 (×6): 0.1 mg via ORAL
  Filled 2011-06-29 (×8): qty 1

## 2011-06-29 MED ORDER — DULOXETINE HCL 30 MG PO CPEP
30.0000 mg | ORAL_CAPSULE | Freq: Every day | ORAL | Status: DC
  Administered 2011-06-29 – 2011-07-02 (×4): 30 mg via ORAL
  Filled 2011-06-29 (×6): qty 1
  Filled 2011-06-29: qty 7

## 2011-06-29 MED ORDER — DICYCLOMINE HCL 20 MG PO TABS: 20.0000 mg | ORAL_TABLET | ORAL | Status: DC | PRN

## 2011-06-29 MED ORDER — VITAMIN D3 25 MCG (1000 UNIT) PO TABS
1000.0000 [IU] | ORAL_TABLET | Freq: Two times a day (BID) | ORAL | Status: DC
  Administered 2011-06-29 – 2011-07-02 (×6): 1000 [IU] via ORAL
  Filled 2011-06-29 (×8): qty 1

## 2011-06-29 MED ORDER — CLONIDINE HCL 0.1 MG PO TABS
0.1000 mg | ORAL_TABLET | ORAL | Status: DC
  Administered 2011-07-01: 0.1 mg via ORAL
  Filled 2011-06-29 (×3): qty 1

## 2011-06-29 MED ORDER — ONDANSETRON 4 MG PO TBDP: 4.0000 mg | ORAL_TABLET | Freq: Four times a day (QID) | ORAL | Status: DC | PRN

## 2011-06-29 MED ORDER — GABAPENTIN 400 MG PO CAPS
800.0000 mg | ORAL_CAPSULE | Freq: Every day | ORAL | Status: DC
  Administered 2011-06-29 – 2011-07-01 (×3): 800 mg via ORAL
  Filled 2011-06-29: qty 1
  Filled 2011-06-29 (×6): qty 2

## 2011-06-29 MED ORDER — ALUM & MAG HYDROXIDE-SIMETH 200-200-20 MG/5ML PO SUSP: 30.0000 mL | ORAL | Status: DC | PRN

## 2011-06-29 MED ORDER — SODIUM CHLORIDE 0.9 % IN NEBU: 3.0000 mL | INHALATION_SOLUTION | Freq: Three times a day (TID) | RESPIRATORY_TRACT | Status: DC | PRN

## 2011-06-29 MED ORDER — ACETAMINOPHEN 500 MG PO TABS
1000.0000 mg | ORAL_TABLET | Freq: Four times a day (QID) | ORAL | Status: DC | PRN
  Administered 2011-07-01: 1000 mg via ORAL

## 2011-06-29 MED ORDER — LOPERAMIDE HCL 2 MG PO CAPS
2.0000 mg | ORAL_CAPSULE | ORAL | Status: DC | PRN
  Administered 2011-06-29: 4 mg via ORAL

## 2011-06-29 MED ORDER — LISINOPRIL 10 MG PO TABS
10.0000 mg | ORAL_TABLET | Freq: Every day | ORAL | Status: DC
  Administered 2011-06-29 – 2011-07-02 (×4): 10 mg via ORAL
  Filled 2011-06-29 (×5): qty 1

## 2011-06-29 MED ORDER — NICOTINE 21 MG/24HR TD PT24
21.0000 mg | MEDICATED_PATCH | Freq: Every day | TRANSDERMAL | Status: DC
  Administered 2011-06-29 – 2011-07-02 (×4): 21 mg via TRANSDERMAL
  Filled 2011-06-29 (×4): qty 1

## 2011-06-29 MED ORDER — HYDROCOD POLST-CHLORPHEN POLST 10-8 MG/5ML PO LQCR
5.0000 mL | Freq: Two times a day (BID) | ORAL | Status: DC | PRN
  Administered 2011-06-30: 5 mL via ORAL
  Filled 2011-06-29: qty 5

## 2011-06-29 NOTE — Progress Notes (Signed)
Pt requested and received Robaxin and neb treatment prn SOB.  Focused on medications. Education given on pain cycle and PAWS as it relates to recovery.
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
## 2011-06-29 NOTE — Progress Notes (Signed)
[facility] Group Notes:  (Counselor/Nursing/MHT/Case Management/Adjunct)  06/29/2011 1:15 PM  Type of Therapy:  Group Therapy, Dance/Movement Therapy   Participation Level:  Active  Participation Quality:  Appropriate and Sharing  Affect:  Appropriate  Cognitive:  Appropriate  Insight:  Good  Engagement in Group:  Good  Engagement in Therapy:  Good  Modes of Intervention:  Clarification, Problem-solving, Role-play, Socialization and Support  Summary of Progress/Problems: pt spoke about feelings and feeling like the color "grey" today for she is not feeling well. Pt spoke about the difficulty in staying sober and how self sabotage and enabling behavior often leads to release. Pt stated that she has pills and refills waiting for her at home and that she knows she needs to tell her dr not to give her more but is reluctant to do so.

## 2011-06-29 NOTE — Tx Team (Signed)
Initial Interdisciplinary Treatment Plan  PATIENT STRENGTHS: (choose at least two) Ability for insight Communication skills General fund of knowledge Motivation for treatment/growth Physical Health Supportive family/friends  PATIENT STRESSORS: Financial difficulties Substance abuse   PROBLEM LIST: Problem List/Patient Goals Date to be addressed Date deferred Reason deferred Estimated date of resolution  Substance Abuse - opiates 06/29/11                                                      DISCHARGE CRITERIA:  Ability to meet basic life and health needs Improved stabilization in mood, thinking, and/or behavior Motivation to continue treatment in a less acute level of care Withdrawal symptoms are absent or subacute and managed without 24-hour nursing intervention  PRELIMINARY DISCHARGE PLAN: Attend aftercare/continuing care group Attend 12-step recovery group Outpatient therapy Return to previous living arrangement  PATIENT/FAMIILY INVOLVEMENT: This treatment plan has been presented to and reviewed with the patient, MILLY GOGGINS.  The patient and family have been given the opportunity to ask questions and make suggestions.  Leana Roe Celerina 06/29/2011, 3:00 AM

## 2011-06-29 NOTE — H&P (Signed)
Psychiatric Admission Assessment Adult  Patient Identification:  Melanie Santiago Date of Evaluation:  06/29/2011 Chief Complaint:  Opiod Dependence    I saw the pt today 1;1 and agree with the NP's key elements stated in her H&P note dated for today.

## 2011-06-29 NOTE — Progress Notes (Signed)
Patient ID: Melanie Santiago, female   DOB: 04-05-1961, 51 y.o.   MRN: [id number] Pt out in milieu quietly interacting. Attended D/c planning this am.  Rates depression at 0 and hopelessness at 3,although stated mood does not match overall affect.  Reports agitation and skin crawling r/t withdrawal.  PRN medications requested and received.  Avoidant of SA issues. Instructed on time frame for prn pain medications. Support offered and 15' checks cont for safety.

## 2011-06-29 NOTE — Progress Notes (Signed)
Patient ID: Melanie Santiago, female   DOB: 1960/07/04, 51 y.o.   MRN: [id number] Voluntary. Patient admitted for detox of prescribed hydrocodone. Patient reports taking 25-30 pills per day. Last use was 06/28/11 at 0800, 1 pill. Has been abusing medication for past 2 years. Prescribed for back pain, has scoliosis. Patient has not received inpatient treatment before, but states detoxing self at home 2-3 times in the past when she is unable to afford pills. Smokes 1.5ppd of cigarettes. Denies any other substance abuse. Denies SI/HI/AV. Able to contract while on the unit. Patient states that addiction is causing stress between patient and husband, husband threatened to leave. States financial problems. Patient polite and cooperative during admission process.

## 2011-06-29 NOTE — Progress Notes (Signed)
Skin assessment on admission: tattoo right ankle, blood draw to right AC, old/healed surgical scars to abdomen, old/healed scar right knee.

## 2011-06-29 NOTE — Progress Notes (Signed)
Pt complains of back pain, given medication as ordered.  No further complaints voiced at this time.  Pt appears depressed but brightens on approach.  Denies SI/HI/hallucinations.  Positive for group, interacting appropriately on unit.  Support and encouragement offered, will continue to monitor.

## 2011-06-30 MED ORDER — LIDOCAINE 5 % EX PTCH
1.0000 | MEDICATED_PATCH | CUTANEOUS | Status: DC
  Administered 2011-06-30 – 2011-07-02 (×3): 1 via TRANSDERMAL
  Filled 2011-06-30 (×3): qty 1

## 2011-06-30 NOTE — Progress Notes (Signed)
[facility] Group Notes:  (Counselor/Nursing/MHT/Case Management/Adjunct)  06/30/2011 1:15 PM  Type of Therapy:  Group Therapy, Dance/Movement Therapy   Participation Level:  Active  Participation Quality:  Appropriate, Attentive, Sharing and Supportive  Affect:  Appropriate  Cognitive:  Appropriate  Insight:  Good  Engagement in Group:  Good  Engagement in Therapy:  Good  Modes of Intervention:  Clarification, Problem-solving, Role-play, Socialization and Support  Summary of Progress/Problems: pt participated in a group discussion on supports and how to use positive supports. Pt spoke about being a positive support for themselves and read 10 "just for today" sayings. Pt spoke about how to apply at least one of the just for today's in their current struggle and upon D/C. Pt spoke specifically about focuing on herself and not others,

## 2011-06-30 NOTE — Progress Notes (Signed)
Patient ID: Melanie Santiago, female   DOB: 02-22-1961, 51 y.o.   MRN: [id number] Pt out in milieu interacting with peers and attending groups.  Cont to have multiple physical complaints r/t withdrawal. PRN medications requested and received. Alternative comfort strategies given. Rates depression at 1 and hopelessness at 7.  Denies SI. Minimal insight into SA issues. Also reports good appetite and sleep.  Cont POC and cont 15' checks for safety.

## 2011-06-30 NOTE — Progress Notes (Signed)
  SARINAH DOETSCH is a 51 y.o. female [id number] 12-06-1960  06/29/2011 Principal Problem:  *Back pain, chronic Active Problems:  Opiate dependence   Mental Status:  alert & oriented -having back pain but denies undue withdrawal. Wants discharge. Denies SI/HI/AVH. Slept allright.   Subjective/Objective: Still having back pain is using all the meds prescribed. Will also order a xylocaine pain patch. Understands that a few more days with medical supervision will help her be successful with not taking opiates.     Filed Vitals:   06/30/11 0637  BP: 99/65  Pulse: 75  Temp:   Resp:     Lab Results:   BMET    Component Value Date/Time   NA 138 06/28/2011 1618   K 4.0 06/28/2011 1618   CL 101 06/28/2011 1618   CO2 24 06/28/2011 1618   GLUCOSE 104* 06/28/2011 1618   BUN 14 06/28/2011 1618   CREATININE 0.80 06/28/2011 1618   CALCIUM 9.8 06/28/2011 1618   GFRNONAA 85* 06/28/2011 1618   GFRAA >90 06/28/2011 1618    Medications:  Scheduled:     . cholecalciferol  1,000 Units Oral BID  . cloNIDine  0.1 mg Oral QID   Followed by  . cloNIDine  0.1 mg Oral BH-qamhs   Followed by  . cloNIDine  0.1 mg Oral QAC breakfast  . DULoxetine  30 mg Oral Daily  . estrogen-methylTESTOSTERone  1 tablet Oral Daily  . gabapentin  400 mg Oral Custom  . gabapentin  800 mg Oral QHS  . lisinopril  10 mg Oral Daily  . nicotine  21 mg Transdermal Q0600  . pantoprazole  40 mg Oral Daily  . zolpidem  10 mg Oral QHS  . DISCONTD: albuterol  2.5 mg Nebulization Q6H  . DISCONTD: gabapentin  400 mg Oral QID     PRN Meds acetaminophen, albuterol, alum & mag hydroxide-simeth, chlorpheniramine-HYDROcodone, dicyclomine, guaiFENesin, hydrOXYzine, loperamide, magnesium hydroxide, methocarbamol, naproxen, ondansetron, sodium chloride, traZODone, DISCONTD: acetaminophen  Plan: Add Zylocaine pain patch.   Kavion Mancinas,MICKIE D. 06/30/2011
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
## 2011-06-30 NOTE — Progress Notes (Signed)
Pt's husband is requesting the pt have a early morning D/C possibly Monday or Tues morning for he works second shift and will only be able to provide transportation early in the morning. Pt is currently being told that she has a possible D/c of Tues AM. Pt needs referrals to out pt therapy and is requesting a set apt before she leaves [facility].

## 2011-06-30 NOTE — Progress Notes (Signed)
[facility] Group Notes:  (Counselor/Nursing/MHT/Case Management/Adjunct)  06/30/2011 6:31 PM  Type of Therapy:  Psychoeducational Skills  Participation Level:  Minimal  Participation Quality:  Appropriate and Attentive  Affect:  Blunted  Cognitive:  Appropriate  Insight:  Good  Engagement in Group:  Good  Engagement in Therapy:  n/a  Modes of Intervention:  Activity, Education, Problem-solving, Socialization and Support  Summary of Progress/Problems: Melanie Santiago attended Psychoeducational group on labels. Melanie Santiago participated in activity labeling self and peers. Group defined what labels are,  how they use them and how they have been labeled as well as listing negative and positive labels they have dealt with. Group read information on practicing self forgiveness. Melanie Santiago was given homework assignment to list negative labels they have had and to find the reality of the label.   Wandra Scot 06/30/2011, 6:31 PM

## 2011-06-30 NOTE — Progress Notes (Signed)
Patient ID: Melanie Santiago, female   DOB: 27-Aug-1960, 51 y.o.   MRN: [id number] Has been sleeping at this time, resp reg, unlabored, no c/o's voiced.  Will continue to monitor.

## 2011-06-30 NOTE — Progress Notes (Signed)
[facility] Adult Inpatient Family/Significant Other Suicide Prevention Education  Suicide Prevention Education:  Education Completed; Husband Nedra Hai) [phone] has been identified by the patient as the family member/significant other with whom the patient will be residing, and identified as the person(s) who will aid the patient in the event of a mental health crisis (suicidal ideations/suicide attempt).  With written consent from the patient, the family member/significant other has been provided the following suicide prevention education, prior to the and/or following the discharge of the patient.  The suicide prevention education provided includes the following:  Suicide risk factors  Suicide prevention and interventions  National Suicide Hotline telephone number  [facility] assessment telephone number  [facility] Emergency Assistance 911  [facility] and/or Residential Mobile Crisis Unit telephone number  Request made of family/significant other to:  Remove weapons (e.g., guns, rifles, knives), all items previously/currently identified as safety concern.    Remove drugs/medications (over-the-counter, prescriptions, illicit drugs), all items previously/currently identified as a safety concern.  The family member/significant other verbalizes understanding of the suicide prevention education information provided.  The family member/significant other agrees to remove the items of safety concern listed above.  [facility] 06/30/2011, 9:26 AM

## 2011-07-01 DIAGNOSIS — F1120 Opioid dependence, uncomplicated: Principal | ICD-10-CM

## 2011-07-01 MED ORDER — LORAZEPAM 1 MG PO TABS
2.0000 mg | ORAL_TABLET | Freq: Four times a day (QID) | ORAL | Status: DC | PRN
  Administered 2011-07-01 – 2011-07-02 (×2): 2 mg via ORAL
  Filled 2011-07-01 (×2): qty 2

## 2011-07-01 MED ORDER — CLONIDINE HCL 0.1 MG PO TABS
0.1000 mg | ORAL_TABLET | Freq: Three times a day (TID) | ORAL | Status: DC
  Administered 2011-07-01 – 2011-07-02 (×2): 0.1 mg via ORAL
  Filled 2011-07-01: qty 1

## 2011-07-01 MED ORDER — CLONIDINE HCL 0.1 MG PO TABS
0.1000 mg | ORAL_TABLET | Freq: Every day | ORAL | Status: DC
  Filled 2011-07-01: qty 1

## 2011-07-01 NOTE — Progress Notes (Signed)
[facility] Group Notes:  (Counselor/Nursing/MHT/Case Management/Adjunct)  07/01/2011 4:11 PM   Type of Therapy:  Processing Group at 11:00 am  Participation Level:    Participation Quality:    Affect:    Cognitive:    Insight:    Engagement in Group:    Engagement in Therapy:    Modes of Intervention:    Summary of Progress/Problems:  [facility] Group Notes:  (Counselor/Nursing/MHT/Case Management/Adjunct)  07/01/2011 4:11 PM   Type of Therapy:  Counseling Group at 1:15 pm  Participation Level:    Participation Quality:    Affect:    Cognitive:    Insight:    Engagement in Group:    Engagement in Therapy:    Modes of Intervention:    Summary of Progress/Problems:  Ronda Fairly, LCSWA 07/01/2011 4:11 PM  [facility] Group Notes:  (Counselor/Nursing/MHT/Case Management/Adjunct)  07/01/2011 4:11 PM   Type of Therapy:  Processing Group at 11:00 am  Participation Level:  Did Not Attend  [facility] Group Notes:  (Counselor/Nursing/MHT/Case Management/Adjunct)  07/01/2011 4:11 PM   Type of Therapy:  Counseling Group at 1:15 pm  Participation Level:  Did Not Attend  Ronda Fairly, LCSWA 07/01/2011 4:11 PM

## 2011-07-01 NOTE — Progress Notes (Signed)
Patient ID: Melanie Santiago, female   DOB: June 07, 1961, 51 y.o.   MRN: [id number] Came to the med window shortly after the start of the shift with c/o back pain, requested and was given naproxen and robaxin, was wearing a lido patch.  Stated afterward that the meds had relieved her pain completely, and that it was d/t scoliosis.  Has been pleasant, cooperative, attended group, and has been interacting appropriately this evening with peers and staff. Remains depressed and sad, but feels is improving.  Will continue to monitor.

## 2011-07-01 NOTE — Progress Notes (Signed)
Patient ID: Melanie Santiago, female   DOB: 09/24/1960, 51 y.o.   MRN: [id number] Nursing         Pt. Is med compliant and attending Groups. She interacts well with peers and staff and rates her Depression and Hopelessness as !/10.States her Goal is to stay busy and be with healthy people. Encouraged and supported.

## 2011-07-01 NOTE — Progress Notes (Signed)
Patient ID: Melanie Santiago, female   DOB: 1960-07-18, 51 y.o.   MRN: [id number] Admission Date: 06/30/2011  07/01/2011 Pt. Met with treatment team today to discuss her goals for this admission. The patient states that she is here to get help with her opiate addiction. However she has limited knowledge of opiate dependence and continues to seek out narcotic medications.  She elects to stay until tomorrow to get more information regarding detox and opiate addiction, but continues to be intrusive with clinical providers and demanding of time and more medication.  She has little ability to delay gratification even for a short period of time and is disruptive and rude to MD and PAC. Her demands upon clinical provider's time causes a long delay in seeing new patients on the hall.  Melanie Santiago is given 1 extra dose of clonidine for the day, ativan for anxiety q 8 hours, but her Tussionex is discontinued due to the narcotic content.  Melanie Santiago is demanding to be discharged first thing in the morning and has little compassion for the needs of other patients on the hall.    A.) Opiate addiction with little insight and poor judgement P.) Since the patient has poor motivation to succeed in her recovery at this time she will be discharged tomorrow. Rona Ravens. Daiton Cowles [facility] 07/01/2011

## 2011-07-01 NOTE — Progress Notes (Signed)
[facility] Case Management Discharge Plan:  Will you be returning to the same living situation after discharge: Yes,  home Would you like a referral for services when you are discharged:Yes,  see below Do you have access to transportation at discharge:Yes,  auto Do you have the ability to pay for your medications:Yes,  insurance  Interagency Information:     Patient to Follow up at:  Follow-up Information    Follow up with [facility] on 07/03/2011. (Your appointment is with Heather at 10:00 on Wed for an assessment.  Plan on staying in town for the group that starts at 1:00)    Contact information:   [address]  [place]  [phone]  Charmian Muff runs the program. The IOP program is in the basement of the [facility] in the upper parking lot at the corner of [address] and [address].  Come down the outside concrete steps to the double doors under the covered porch.         Patient denies SI/HI:   Yes,  yes    Safety Planning and Suicide Prevention discussed:  Yes,  yes  Barrier to discharge identified:No.  Summary and Recommendations:   Melanie Santiago 07/01/2011, 2:28 PM

## 2011-07-01 NOTE — Treatment Plan (Addendum)
Interdisciplinary Treatment Plan Update (Adult)  Date: 07/01/2011  Time Reviewed: 9:41 AM   Progress in Treatment: Attending groups: Yes Participating in groups: Yes Taking medication as prescribed: Yes Tolerating medication: Yes   Family/Significant othe contact made:   Patient understands diagnosis:  No Limited insight, poor judgement Discussing patient identified problems/goals with staff:  Yes Medical problems stabilized or resolved:  Yes Denies suicidal/homicidal ideation: Yes Issues/concerns per patient self-inventory:  Wishes to d/c today Other:  New problem(s) identified: N/A  Reason for Continuation of Hospitalization: Other; describe D/C today  Interventions implemented related to continuation of hospitalization:   Additional comments:  Estimated length of stay: D/C today  Discharge Plan: Return home  Follow up outpt.  New goal(s): N/A  Review of initial/current patient goals per problem list:   1.  Goal(s):Safely detox from opiates  Met:  No  Target date:  As evidenced FA:OZHYQ c/o withdrawal symptoms  2.  Goal (s):Set up for services on an outpatient basis  Met:  Yes  Target date:  As evidenced by :Appointment with [facility] for assessment  3.  Goal(s):D/C ASAP  Met:  Yes  Target date:  As evidenced by:  4.  Goal(s):  Met:  Yes  Target date:  As evidenced by:  Attendees: Patient: Melanie Santiago  07/01/2011 9:41 AM  Family:     Physician:  Lupe Carney 07/01/2011 9:41 AM   Nursing: Milinda Cave   07/01/2011 9:41 AM   Case Manager:  Richelle Ito, LCSW 07/01/2011 9:41 AM   Counselor:  Ronda Fairly, LCSWA 07/01/2011 9:41 AM   Other: Shelda Jakes PA  07/01/2011 9:41 AM  Other:     Other:     Other:      Scribe for Treatment Team:   Ida Rogue, 07/01/2011 9:41 AM

## 2011-07-01 NOTE — Discharge Planning (Signed)
Melanie Santiago admitted on Saturday for opiate detox.  Admitted she has been using a month supply in a week and a half and then buying off the streets.  Lives at home with husband.  Plan for sobriety is to apply for work so as to be occupied during the day, and attend NA mtgs in [place] or [place].  Hopes to d/c today, tomorrow at latest.

## 2011-07-02 MED ORDER — BENZONATATE 100 MG PO CAPS: 100.0000 mg | ORAL_CAPSULE | Freq: Three times a day (TID) | ORAL | Status: DC

## 2011-07-02 MED ORDER — DULOXETINE HCL 30 MG PO CPEP: ORAL_CAPSULE | ORAL | Status: DC

## 2011-07-02 MED ORDER — GABAPENTIN 400 MG PO CAPS: ORAL_CAPSULE | ORAL | Status: DC

## 2011-07-02 MED ORDER — GABAPENTIN 800 MG PO TABS: 400.0000 mg | ORAL_TABLET | Freq: Four times a day (QID) | ORAL | Status: DC

## 2011-07-02 MED ORDER — BENZONATATE 100 MG PO CAPS: 100.0000 mg | ORAL_CAPSULE | Freq: Three times a day (TID) | ORAL | Status: AC

## 2011-07-02 MED ORDER — TRAZODONE HCL 50 MG PO TABS: 50.0000 mg | ORAL_TABLET | Freq: Every evening | ORAL | Status: DC | PRN

## 2011-07-02 MED ORDER — GABAPENTIN 800 MG PO TABS
400.0000 mg | ORAL_TABLET | Freq: Four times a day (QID) | ORAL | Status: DC
  Filled 2011-07-02: qty 21

## 2011-07-02 MED ORDER — BENZONATATE 100 MG PO CAPS
100.0000 mg | ORAL_CAPSULE | Freq: Three times a day (TID) | ORAL | Status: DC
  Filled 2011-07-02 (×3): qty 1

## 2011-07-02 NOTE — Progress Notes (Signed)
Pt laying in bed resting with eyes closed. Respirations even and unlabored. No distress noted.  

## 2011-07-02 NOTE — Progress Notes (Signed)
Recreation Therapy Group Note  Date: 07/02/2011        Time: 1000       Group Topic/Focus: Patient invited to participate in animal assisted therapy. Pets as a coping skill and responsibility were discussed.   Participation Level: Active  Participation Quality: Appropriate and Attentive  Affect: Appropriate  Cognitive: Appropriate and Oriented   Additional Comments: None   

## 2011-07-02 NOTE — Progress Notes (Signed)
[facility] Group Notes:  (Counselor/Nursing/MHT/Case Management/Adjunct)  07/02/2011 4:39 PM   Type of Therapy:  Processing Group at 11:00 am  Participation Level:  Pt discharging did not attend   Ronda Fairly, LCSWA 07/02/2011 4:39 PM

## 2011-07-02 NOTE — Discharge Planning (Signed)
Pt did not end up d/cing yesterday.  D/C today.
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
## 2011-07-02 NOTE — Progress Notes (Signed)
Patient ID: Melanie Santiago, female   DOB: 1960/06/21, 51 y.o.   MRN: [id number] Pt was discharged at 11:30.  She denies SI/HI. She verbalizes understanding of d/c med and followup.  She was given sample meds and scripts by MD.  She was given suicide prevention pamphlet .  Pt rode home with her husband.

## 2011-07-02 NOTE — Discharge Summary (Signed)
Melanie Santiago 1960-12-16 51 y.o. [id number]    Date of Admission:  06/29/2011 Date of Discharge:  07/02/2011 Diagnosis: Opiate addiction Diagnosis on Discharge:  Axis I: Opiate addiction  HPI: Melanie Santiago was admitted to [facility] from [facility] where she presented with request for assistance with detoxing from the opiate pain medication she has been abusing for over five years. Hospital Course:      The duration of Melanie Santiago"s stay at [name] was brief and unremarkable.      The patient was seen and evaluated by the Treatment team consisting of Psychiatrist, PAC, RN, Case Manager, and Therapist for evaluation and treatment plan with goal of stabilization upon discharge. The patient's physical and mental health problems were identified and treated appropriately.       When Melanie Santiago met with the treatment team she requested discharge and presented a plan for follow up at [facility] meetings.  It was apparent that she had limited insight regarding her opiate addiction and she agreed to stay 24 more hours at the request of the treatment team and physician.  She was given Tussionex cough suppressant the day before as well as Ambien.  Both of those were discontinued and the patient immediately reported symptoms of withdrawal which were not seen by the staff. She repeatedly demanded more time from clinician and clinical providers regarding her medications and showed no tolerance for delayed gratification. The patient then began a rant outside the MD's office with other patients.  She was redirected to her room without further incident.     She has requested discharge today and feels that she is through her detox.     The patient was evaluated and found to be stable enough for discharge and was released to  home per the initial plan of treatment.  For MSE see SRA completed by MD. LABS: None pending  Level of Care:  ACUTE MEDS on Discharge:     DULoxetine (CYMBALTA) 30 MG capsule This is for pain and anxiety. Qty: 30 capsule, Refills:  0    gabapentin (NEURONTIN) 400 MG capsule Take one capsule 4 x a day and 2 capsules at bedtime.  This is for pain and anxiety. Qty: 90 capsule, Refills: 0    traZODone (DESYREL) 50 MG tablet Take 1 tablet (50 mg total) by mouth at bedtime as needed and may repeat dose one time if needed for sleep. Qty: 30 tablet, Refills: 0      CONTINUE these medications which have NOT CHANGED   Details  albuterol (PROVENTIL HFA;VENTOLIN HFA) 108 (90 BASE) MCG/ACT inhaler Inhale 2 puffs into the lungs every 6 (six) hours as needed. For shortness of breath.    cholecalciferol (VITAMIN D) 1000 UNITS tablet Take 1,000 Units by mouth 2 (two) times daily.    esomeprazole (NEXIUM) 20 MG capsule Take 20 mg by mouth daily before breakfast.    albuterol (PROVENTIL) (2.5 MG/3ML) 0.083% nebulizer solution Take 2.5 mg by nebulization every 6 (six) hours as needed. For shortness of breath.    estrogen-methylTESTOSTERone (ESTRATEST) 1.25-2.5 MG per tablet Take 1 tablet by mouth daily.    lisinopril (PRINIVIL,ZESTRIL) 10 MG tablet Take 10 mg by mouth daily.      STOP taking these medications     acetaminophen (TYLENOL) 500 MG tablet      ALPRAZolam (XANAX) 0.5 MG tablet      carisoprodol (SOMA) 350 MG tablet      chlorpheniramine-HYDROcodone (TUSSIONEX) 10-8 MG/5ML LQCR      HYDROcodone-acetaminophen (LORTAB) 10-500 MG  per tablet      zolpidem (AMBIEN) 10 MG tablet        Discharge Orders    Future Orders Please Complete By Expires   Diet - low sodium heart healthy      Increase activity slowly      Discharge instructions      Comments:   Please follow up with your primary care MD as directed with in the next two weeks to get any further refills on the medication we have prescribed. Please call today and schedule a follow up visit within the next two weeks so you won't have a delay in getting refills you may need.      Follow-up Information    Follow up with [facility] on  07/03/2011. (Your appointment is with Heather at 10:00 on Wed for an assessment.  Plan on staying in town for the group that starts at 1:00)    Contact information:   [address]  [place]  [phone]  Charmian Muff runs the program. The IOP program is in the basement of the [facility] in the upper parking lot at the corner of [address] and [address].  Come down the outside concrete steps to the double doors under the covered porch.       Follow-up recommendations:  Other:   Discharge destination:  Home [facility] T. Andretta Ergle PAC DR.Alyson Kuroski-MazzieMD 07/02/2011

## 2011-07-02 NOTE — Progress Notes (Signed)
Pt lying in bed.  Was able to awake with some effort.  Pt thought it was morning.  Wanted to talk with the MD.  Informed pt it was night time.  Pt sedated from meds given earlier.  HS meds given as ordered.  Pt voices no needs/concerns at this time.  Safety maintained with q15 minute checks.

## 2011-07-04 NOTE — Progress Notes (Signed)
Patient Discharge Instructions:  Admission Note Faxed,  07/03/2011 After Visit Summary Faxed,  07/03/2011 Faxed to the Next Level Care provider:  07/03/2011 D/C Summary faxed 07/03/2011 Facesheet faxed 07/03/2011  Faxed to [facility] - Daneil Dolin @ [phone]  Wandra Scot, 07/04/2011, 12:47 PM

## 2013-06-04 ENCOUNTER — Other Ambulatory Visit (HOSPITAL_COMMUNITY): Payer: Self-pay | Admitting: Family Medicine

## 2013-06-04 ENCOUNTER — Ambulatory Visit (HOSPITAL_COMMUNITY)
Admission: RE | Admit: 2013-06-04 | Discharge: 2013-06-04 | Disposition: A | Discharge status: Home / Self Care | Payer: BC Managed Care – PPO | Source: Ambulatory Visit | Attending: Family Medicine | Admitting: Family Medicine

## 2013-06-04 DIAGNOSIS — M4120 Other idiopathic scoliosis, site unspecified: Secondary | ICD-10-CM | POA: Insufficient documentation

## 2013-06-04 DIAGNOSIS — M47817 Spondylosis without myelopathy or radiculopathy, lumbosacral region: Secondary | ICD-10-CM | POA: Insufficient documentation

## 2013-06-04 DIAGNOSIS — M545 Low back pain, unspecified: Secondary | ICD-10-CM | POA: Insufficient documentation

## 2013-06-04 DIAGNOSIS — G8929 Other chronic pain: Secondary | ICD-10-CM | POA: Diagnosis present

## 2014-01-10 ENCOUNTER — Institutional Professional Consult (permissible substitution): Payer: Self-pay | Admitting: Internal Medicine

## 2014-01-11 ENCOUNTER — Encounter: Payer: Self-pay | Admitting: Internal Medicine

## 2014-01-11 ENCOUNTER — Ambulatory Visit (INDEPENDENT_AMBULATORY_CARE_PROVIDER_SITE_OTHER): Payer: BC Managed Care – PPO | Admitting: Internal Medicine

## 2014-01-11 VITALS — BP 118/64 | HR 70 | Temp 97.8°F | Ht 65.0 in | Wt 126.0 lb

## 2014-01-11 DIAGNOSIS — I10 Essential (primary) hypertension: Secondary | ICD-10-CM

## 2014-01-11 DIAGNOSIS — J449 Chronic obstructive pulmonary disease, unspecified: Secondary | ICD-10-CM

## 2014-01-11 DIAGNOSIS — F17200 Nicotine dependence, unspecified, uncomplicated: Secondary | ICD-10-CM

## 2014-01-11 MED ORDER — NEBIVOLOL HCL 5 MG PO TABS: 5.0000 mg | ORAL_TABLET | Freq: Every day | ORAL | Status: DC

## 2014-01-11 MED ORDER — PREDNISONE 10 MG PO TABS: ORAL_TABLET | ORAL | Status: DC

## 2014-01-11 MED ORDER — MOMETASONE FURO-FORMOTEROL FUM 200-5 MCG/ACT IN AERO: INHALATION_SPRAY | RESPIRATORY_TRACT | Status: DC

## 2014-01-11 MED ORDER — AMOXICILLIN-POT CLAVULANATE 875-125 MG PO TABS: 1.0000 | ORAL_TABLET | Freq: Two times a day (BID) | ORAL | Status: DC

## 2014-01-11 NOTE — Patient Instructions (Addendum)
dulera 200 Take 2 puffs first thing in am and then another 2 puffs about 12 hours later.   Work on inhaler technique:  relax and gently blow all the way out then take a nice smooth deep breath back in, triggering the inhaler at same time you start breathing in.  Hold for up to 5 seconds if you can.  Rinse and gargle with water when done  Only use your nebulizer as a rescue medication to be used if you can't catch your breath by resting or doing a relaxed purse lip breathing pattern.  - The less you use it, the better it will work when you need it. - Ok to use up to  every 4 hours if you must but call for immediate appointment if use goes up over your usual need     Stop advair and lisinopril  Start bystolic 5 mg dialy   Prednisone 10 mg take  4 each am x 2 days,   2 each am x 2 days,  1 each am x 2 days and stop   If still green sputum when you finish cipro > Augmentin 875 mg take one pill twice daily  X 10 days - take at breakfast and supper with large glass of water.  It would help reduce the usual side effects (diarrhea and yeast infections) if you ate cultured yogurt at lunch.   Please schedule a follow up office visit in 2 weeks, sooner if needed with all active medications in hand

## 2014-01-11 NOTE — Progress Notes (Signed)
   Subjective:    Patient ID: Melanie Santiago, female    DOB: 06/06/1961  MRN: [id number]  HPI  65 yowf active smoker with freq bronchial infections starting in her 83s gradually more frequent and then progressive sob since around 2012 with pfts are c/w GOLD III COPD referred by Dr Particia Nearing to pulmonary clinic 01/11/2014   01/11/2014 1st [facility] office visit/ [name] Chief Complaint  Patient presents with  . Pulmonary Consult    Referred by Particia Nearing, PA for COPD. Pt c/o dizziness this morning and mild prod cough with green mucous.   does ok walking flat indoors still able to do [employer]  But it's a struggle even on advair 500 and acei and green sputum most ams.  No obvious     day to day or daytime variabilty or assoc chronic cough or cp or chest tightness, subjective wheeze overt sinus or hb symptoms. No unusual exp hx or h/o childhood pna/ asthma or knowledge of premature birth.  Sleeping ok without nocturnal  or early am exacerbation  of respiratory  c/o's or need for noct saba. Also denies any obvious fluctuation of symptoms with weather or environmental changes or other aggravating or alleviating factors except as outlined above   Current Medications, Allergies, Complete Past Medical History, Past Surgical History, Family History, and Social History were reviewed in [employer] record.           Review of Systems  Constitutional: Negative for fever and unexpected weight change.  HENT: Positive for congestion, rhinorrhea and sinus pressure. Negative for dental problem, ear pain, nosebleeds, postnasal drip, sneezing, sore throat and trouble swallowing.   Eyes: Negative for redness and itching.  Respiratory: Positive for cough, chest tightness and shortness of breath. Negative for wheezing.   Cardiovascular: Negative for palpitations and leg swelling.  Gastrointestinal: Negative for nausea and vomiting.  Genitourinary: Negative for dysuria.    Musculoskeletal: Negative for joint swelling.  Skin: Negative for rash.  Neurological: Negative for headaches.  Hematological: Does not bruise/bleed easily.  Psychiatric/Behavioral: Negative for dysphoric mood. The patient is not nervous/anxious.        Objective:   Physical Exam Wt Readings from Last 3 Encounters:  01/11/14 126 lb (57.153 kg)  06/29/11 138 lb (62.596 kg)  10/10/09 141 lb (63.957 kg)     amb wf with prominent pseudowheeze  HEENT: nl dentition, turbinates, and orophanx. Nl external ear canals without cough reflex   NECK :  without JVD/Nodes/TM/ nl carotid upstrokes bilaterally   LUNGS: no acc muscle use, bilateral late exp rhonchi   CV:  RRR  no s3 or murmur or increase in P2, no edema   ABD:  soft and nontender with nl excursion in the supine position. No bruits or organomegaly, bowel sounds nl  MS:  warm without deformities, calf tenderness, cyanosis or clubbing  SKIN: warm and dry without lesions    NEURO:  alert, approp, no deficits            Assessment & Plan:

## 2014-01-13 DIAGNOSIS — F17210 Nicotine dependence, cigarettes, uncomplicated: Secondary | ICD-10-CM | POA: Insufficient documentation

## 2014-01-13 NOTE — Assessment & Plan Note (Signed)

## 2014-01-13 NOTE — Assessment & Plan Note (Addendum)
-   PFts 01/13/13 with flat insp loop and abn exp loop as well with fev1  1.10 (35%) and ratio 47 - 01/11/2014 p extensive coaching HFA effectiveness =    75%    DDX of  difficult airways management all start with A and  include Adherence, Ace Inhibitors, Acid Reflux, Active Sinus Disease, Alpha 1 Antitripsin deficiency, Anxiety masquerading as Airways dz,  ABPA,  allergy(esp in young), Aspiration (esp in elderly), Adverse effects of DPI,  Active smokers, plus two Bs  = Bronchiectasis and Beta blocker use..and one C= CHF  Adherence is always the initial "prime suspect" and is a multilayered concern that requires a "trust but verify" approach in every patient - starting with knowing how to use medications, especially inhalers, correctly, keeping up with refills and understanding the fundamental difference between maintenance and prns vs those medications only taken for a very short course and then stopped and not refilled.  The proper method of use, as well as anticipated side effects, of a metered-dose inhaler are discussed and demonstrated to the patient. Improved effectiveness after extensive coaching during this visit to a level of approximately  75%   Active smoking greatest concern > discussed separately  Acei > needs trial off   ? Allergy > Prednisone 10 mg take  4 each am x 2 days,   2 each am x 2 days,  1 each am x 2 days and stop  ? Active sinus dz > finish cipro the augmentin if sputum still purulent  ? Adverse effect of dpi, esp advair > try off and just use saba prn for now   Each maintenance medication was reviewed in detail including most importantly the difference between maintenance and as needed and under what circumstances the prns are to be used.  Please see instructions for details which were reviewed in writing and the patient given a copy.    Regroup in 2 weeks

## 2014-01-13 NOTE — Assessment & Plan Note (Signed)
Strongly prefer in this setting: Bystolic, the most beta -1  selective Beta blocker available in sample form, with bisoprolol the most selective generic choice  on the market. - try samples of bystolic 5 mg daily and stop lisinopril

## 2014-01-24 ENCOUNTER — Telehealth: Payer: Self-pay | Admitting: Internal Medicine

## 2014-01-24 MED ORDER — NEBIVOLOL HCL 5 MG PO TABS: 5.0000 mg | ORAL_TABLET | Freq: Every day | ORAL | Status: DC

## 2014-01-24 NOTE — Telephone Encounter (Signed)
Pt states she can not afford bystolic Rx.  Pt would like to p/u samples tomorrow & can be reached at [phone].  Melanie Santiago

## 2014-01-24 NOTE — Telephone Encounter (Signed)
Pt states that she cannot afford the Bystolic 5mg  Pt requesting samples of Bystolic 5mg --- pt aware that we do not have any samples of this medication. Pt has appt with MW 01/25/2014 at 11am Will discuss with MW at this appt.  Nothing further needed.

## 2014-01-24 NOTE — Telephone Encounter (Signed)
RX has been sent. atc line rang numerous times and no answer, no VM WCB

## 2014-01-25 ENCOUNTER — Ambulatory Visit (INDEPENDENT_AMBULATORY_CARE_PROVIDER_SITE_OTHER): Payer: BC Managed Care – PPO | Admitting: Internal Medicine

## 2014-01-25 ENCOUNTER — Ambulatory Visit (INDEPENDENT_AMBULATORY_CARE_PROVIDER_SITE_OTHER)
Admission: RE | Admit: 2014-01-25 | Discharge: 2014-01-25 | Disposition: A | Discharge status: Home / Self Care | Payer: BC Managed Care – PPO | Source: Ambulatory Visit | Attending: Internal Medicine | Admitting: Internal Medicine

## 2014-01-25 ENCOUNTER — Ambulatory Visit: Payer: Self-pay | Admitting: Internal Medicine

## 2014-01-25 ENCOUNTER — Encounter: Payer: Self-pay | Admitting: Internal Medicine

## 2014-01-25 VITALS — BP 140/78 | HR 60 | Temp 98.0°F | Ht 65.0 in | Wt 124.0 lb

## 2014-01-25 DIAGNOSIS — R059 Cough, unspecified: Secondary | ICD-10-CM

## 2014-01-25 DIAGNOSIS — J449 Chronic obstructive pulmonary disease, unspecified: Secondary | ICD-10-CM

## 2014-01-25 DIAGNOSIS — R05 Cough: Secondary | ICD-10-CM

## 2014-01-25 DIAGNOSIS — I10 Essential (primary) hypertension: Secondary | ICD-10-CM

## 2014-01-25 DIAGNOSIS — F17200 Nicotine dependence, unspecified, uncomplicated: Secondary | ICD-10-CM

## 2014-01-25 MED ORDER — MOMETASONE FURO-FORMOTEROL FUM 100-5 MCG/ACT IN AERO: INHALATION_SPRAY | RESPIRATORY_TRACT | Status: DC

## 2014-01-25 MED ORDER — TRAMADOL HCL 50 MG PO TABS: ORAL_TABLET | ORAL | Status: DC

## 2014-01-25 NOTE — Patient Instructions (Addendum)
You are unlikely to be able physical labor based on your lung function  Your breathing and coughing should improve if you stay on dulera 100 Take 2 puffs first thing in am and then another 2 puffs about 12 hours later.   Work on inhaler technique:  relax and gently blow all the way out then take a nice smooth deep breath back in, triggering the inhaler at same time you start breathing in.  Hold for up to 5 seconds if you can.  Rinse and gargle with water when done  Only use your albuterol as a rescue medication to be used if you can't catch your breath by resting or doing a relaxed purse lip breathing pattern.  - The less you use it, the better it will work when you need it. - Ok to use up  every 4 hours if you must but call for immediate appointment if use goes up over your usual need - Don't leave home without it !!  (think of it like the spare tire for your car)   Stay on bystolic 5 mg daily   Take delsym two tsp every 12 hours and supplement if needed with  tramadol 50 mg up to 2 every 4 hours to suppress the urge to cough. Swallowing water or using ice chips/non mint and menthol containing candies (such as lifesavers or sugarless jolly ranchers) are also effective.  You should rest your voice and avoid activities that you know make you cough.  Once you have eliminated the cough for 3 straight days try reducing the tramadol first,  then the delsym as tolerated.    Prilosec is 20 mg Take 30-60 min before first meal of the day   Please remember to go to the  x-ray department downstairs for your tests - we will call you with the results when they are available.    Please schedule a follow up office visit in 4 weeks, sooner if needed with all medications in hand from all doctors for PFTs on return

## 2014-01-25 NOTE — Progress Notes (Signed)
Subjective:    Patient ID: Melanie Santiago, female    DOB: Aug 23, 1960  MRN: [id number]    Brief patient profile:  55 yowf active smoker with freq bronchial infections starting in her 45s gradually more frequent and then progressive sob since around 2012 with pfts are c/w GOLD III COPD referred by Dr Particia Nearing to pulmonary clinic 01/11/2014   01/11/2014 1st [facility] office visit/ Melanie Santiago Chief Complaint  Patient presents with  . Pulmonary Consult    Referred by Particia Nearing, PA for COPD. Pt c/o dizziness this morning and mild prod cough with green mucous.   does ok walking flat indoors still able to do [employer]  But it's a struggle even on advair 500 and acei and green sputum most ams. rec dulera 200 Take 2 puffs first thing in am and then another 2 puffs about 12 hours later.  Work on inhaler technique:   Only use your nebulizer as a rescue medication   Stop advair and lisinopril Start bystolic 5 mg dialy  Prednisone 10 mg take  4 each am x 2 days,   2 each am x 2 days,  1 each am x 2 days and stop  If still green sputum when you finish cipro > Augmentin 875 mg take one pill twice daily  X 10 days   Please schedule a follow up office visit in 2 weeks, sooner if needed with all active medications in hand    01/25/2014 f/u ov/Melanie Santiago re: no meds, not even rescue on hand/ still smoking  Chief Complaint  Patient presents with  . Follow-up    Pt states that her breathing and cough is unchanged since the last visit. She uses neb 4-5 times per wk. She states that she did not take the augmentin that we prescribed b/c she is allergic.   no change green mucus p cipro Still struggles to walk on flat surfaces like [employer] s sob p ever aisle Not clearly less cough on dulera 200  Not clearly less need for saba since on dulera   No obvious day to day or daytime variabilty or assoc  cp or chest tightness, subjective wheeze overt sinus or hb symptoms. No unusual exp hx or h/o childhood pna/  asthma or knowledge of premature birth.  Sleeping ok without nocturnal  or early am exacerbation  of respiratory  c/o's or need for noct saba. Also denies any obvious fluctuation of symptoms with weather or environmental changes or other aggravating or alleviating factors except as outlined above   Current Medications, Allergies, Complete Past Medical History, Past Surgical History, Family History, and Social History were reviewed in [employer] record.  ROS  The following are not active complaints unless bolded sore throat, dysphagia, dental problems, itching, sneezing,  nasal congestion or excess/ purulent secretions, ear ache,   fever, chills, sweats, unintended wt loss, pleuritic or exertional cp, hemoptysis,  orthopnea pnd or leg swelling, presyncope, palpitations, heartburn, abdominal pain, anorexia, nausea, vomiting, diarrhea  or change in bowel or urinary habits, change in stools or urine, dysuria,hematuria,  rash, arthralgias, visual complaints, headache, numbness weakness or ataxia or problems with walking or coordination,  change in mood/affect or memory.                       Objective:   Physical Exam  8/11/`5           124 Wt Readings from Last 3 Encounters:  01/11/14 126  lb (57.153 kg)  06/29/11 138 lb (62.596 kg)  10/10/09 141 lb (63.957 kg)     amb wf with no longer prominent pseudowheeze but hopeless/ helpless affect   HEENT: nl dentition, turbinates, and orophanx. Nl external ear canals without cough reflex   NECK :  without JVD/Nodes/TM/ nl carotid upstrokes bilaterally   LUNGS: no acc muscle use, min bilateral late exp rhonchi   CV:  RRR  no s3 or murmur or increase in P2, no edema   ABD:  soft and nontender with nl excursion in the supine position. No bruits or organomegaly, bowel sounds nl  MS:  warm without deformities, calf tenderness, cyanosis or clubbing  SKIN: warm and dry without lesions    NEURO:  alert, approp, no  deficits       CXR  01/25/2014 : No acute cardiopulmonary disease.  Suggestion mild COPD.       Assessment & Plan:

## 2014-01-26 NOTE — Progress Notes (Signed)
Quick Note:  Pt returned call. Informed pt of results and recs per MW. Pt verbalized understanding and denied any further questions or concerns at this time. ______

## 2014-01-27 NOTE — Assessment & Plan Note (Signed)
>   3 m  I took an extended  opportunity with this patient to outline the consequences of continued cigarette use  in airway disorders based on all the data we have from the multiple national lung health studies (perfomed over decades at millions of dollars in cost)  indicating that smoking cessation, not choice of inhalers or physicians, is the most important aspect of care.   

## 2014-01-27 NOTE — Assessment & Plan Note (Addendum)
-   PFts 01/13/13 with flat insp loop and abn exp loop as well with fev1  1.10 (35%) and ratio 47 - 01/25/2014  Walked RA x 3 laps @ 185 ft each stopped due to  Fatigue / not sob, no tachycardia/no desat  - 01/25/2014 p extensive coaching HFA effectiveness =   75%  - spirometry 01/25/14  FEV1  0.87 (32%) ratio 53 with abn f/v but no flattening   She has at least moderate copd but note Symptoms are markedly disproportionate to objective findings and not clear this is a lung problem but pt does appear to have difficult airway management issues. DDX of  difficult airways management all start with A and  include Adherence, Ace Inhibitors, Acid Reflux, Active Sinus Disease, Alpha 1 Antitripsin deficiency, Anxiety masquerading as Airways dz,  ABPA,  allergy(esp in young), Aspiration (esp in elderly), Adverse effects of DPI,  Active smokers, plus two Bs  = Bronchiectasis and Beta blocker use..and one C= CHF  Adherence is always the initial "prime suspect" and is a multilayered concern that requires a "trust but verify" approach in every patient - starting with knowing how to use medications, especially inhalers, correctly, keeping up with refills and understanding the fundamental difference between maintenance and prns vs those medications only taken for a very short course and then stopped and not refilled.  The proper method of use, as well as anticipated side effects, of a metered-dose inhaler are discussed and demonstrated to the patient. Improved effectiveness after extensive coaching during this visit to a level of approximately  75% so continue dulera   Anxiety/ depression  usually dx of exclusion but near to top of list here/ note h/o drug abuse  ? acei case > off x 2 weeks, too soon to be sure   ? Acid (or non-acid) GERD > always difficult to exclude as up to 75% of pts in some series report no assoc GI/ Heartburn symptoms> rec   Trial of ppi ac daily  and diet restrictions/ reviewed and instructions given  in writing.   ? Active sinus dz or bronchiectasis in ddx due to reported green sputum p cipro > hold further abx for now   Active smoking still greatest concern, discussed separately

## 2014-01-27 NOTE — Assessment & Plan Note (Signed)
Try off acei 01/11/14 due to ? Pseudoasthma   Needs full 6 weeks off then regroup > samples of bystolic 5 mg daily provided until then

## 2014-01-27 NOTE — Assessment & Plan Note (Signed)
Try lower dose of dulera to 100 2bid to see if irritates upper airway less

## 2014-02-04 ENCOUNTER — Ambulatory Visit: Payer: Self-pay | Admitting: Internal Medicine

## 2014-02-24 ENCOUNTER — Other Ambulatory Visit: Payer: Self-pay | Admitting: Internal Medicine

## 2014-02-24 ENCOUNTER — Telehealth: Payer: Self-pay | Admitting: Internal Medicine

## 2014-02-24 DIAGNOSIS — R059 Cough, unspecified: Secondary | ICD-10-CM

## 2014-02-24 DIAGNOSIS — R05 Cough: Secondary | ICD-10-CM

## 2014-02-24 MED ORDER — TRAMADOL HCL 50 MG PO TABS: ORAL_TABLET | ORAL | Status: DC

## 2014-02-24 NOTE — Telephone Encounter (Signed)
That's fine  rx #20

## 2014-02-24 NOTE — Telephone Encounter (Signed)
Called spoke with pt. She reports she is coughing up yellow phlem, lots of congestion. Pt reports she is wanting a few tramadol called in.  Pt is scheduled to see MW tomorrow after PFT. Please advise thanks  Allergies  Allergen Reactions  . Augmentin [Amoxicillin-Pot Clavulanate] Nausea And Vomiting  . Sulfa Antibiotics     "makes me sick"     Current Outpatient Prescriptions on File Prior to Visit  Medication Sig Dispense Refill  . albuterol (PROVENTIL HFA;VENTOLIN HFA) 108 (90 BASE) MCG/ACT inhaler Inhale 2 puffs into the lungs every 6 (six) hours as needed. For shortness of breath.      Marland Kitchen albuterol (PROVENTIL) (2.5 MG/3ML) 0.083% nebulizer solution Take 2.5 mg by nebulization every 6 (six) hours as needed. For shortness of breath.      . ALPRAZolam (XANAX) 1 MG tablet Take 1 mg by mouth 2 (two) times daily as needed for anxiety.      . calcium carbonate (OS-CAL) 600 MG TABS tablet Take 600 mg by mouth 2 (two) times daily with a meal.      . carisoprodol (SOMA) 350 MG tablet Take 350 mg by mouth 4 (four) times daily.      . cholecalciferol (VITAMIN D) 1000 UNITS tablet Take 1,000 Units by mouth 2 (two) times daily.      Marland Kitchen estrogen-methylTESTOSTERone (ESTRATEST) 1.25-2.5 MG per tablet Take 1 tablet by mouth daily.      . fluticasone (FLONASE) 50 MCG/ACT nasal spray Place into both nostrils as needed.      . meclizine (ANTIVERT) 25 MG tablet Take 25 mg by mouth 3 (three) times daily as needed for dizziness.      . modafinil (PROVIGIL) 200 MG tablet Take 200 mg by mouth daily.      . mometasone-formoterol (DULERA) 100-5 MCG/ACT AERO Take 2 puffs first thing in am and then another 2 puffs about 12 hours later.      . nebivolol (BYSTOLIC) 5 MG tablet Take 1 tablet (5 mg total) by mouth daily.  30 tablet  3  . NICODERM CQ 21 MG/24HR patch Place onto the skin daily.      Marland Kitchen omeprazole (PRILOSEC) 20 MG capsule Take 20 mg by mouth daily.      Marland Kitchen oxycodone (OXY-IR) 5 MG capsule Take 10 mg by mouth  every 4 (four) hours as needed.      . polyethylene glycol powder (GLYCOLAX/MIRALAX) powder as needed.      . traMADol (ULTRAM) 50 MG tablet 1-2 every 4 hours as needed for cough or pain  40 tablet  0  . traZODone (DESYREL) 100 MG tablet Take 1 tablet by mouth at bedtime as needed.      . traZODone (DESYREL) 50 MG tablet Take 1 tablet (50 mg total) by mouth at bedtime as needed and may repeat dose one time if needed for sleep.  30 tablet  0   No current facility-administered medications on file prior to visit.

## 2014-02-24 NOTE — Telephone Encounter (Signed)
Called pt. She is requesting this be called into [facility]. I have called this in. Nothing further needed

## 2014-02-25 ENCOUNTER — Ambulatory Visit (INDEPENDENT_AMBULATORY_CARE_PROVIDER_SITE_OTHER): Payer: BC Managed Care – PPO | Admitting: Internal Medicine

## 2014-02-25 ENCOUNTER — Encounter: Payer: Self-pay | Admitting: Internal Medicine

## 2014-02-25 VITALS — BP 98/60 | HR 73 | Temp 97.7°F | Ht 65.0 in | Wt 123.0 lb

## 2014-02-25 DIAGNOSIS — R059 Cough, unspecified: Secondary | ICD-10-CM

## 2014-02-25 DIAGNOSIS — I10 Essential (primary) hypertension: Secondary | ICD-10-CM

## 2014-02-25 DIAGNOSIS — R05 Cough: Secondary | ICD-10-CM

## 2014-02-25 DIAGNOSIS — J449 Chronic obstructive pulmonary disease, unspecified: Secondary | ICD-10-CM

## 2014-02-25 LAB — PULMONARY FUNCTION TEST
FEF 25-75 Post: 0.61 L/sec
FEF 25-75 Pre: 0.34 L/sec
FEF2575-%Change-Post: 80 %
FEF2575-%PRED-POST: 22 %
FEF2575-%Pred-Pre: 12 %
FEV1-%CHANGE-POST: 31 %
FEV1-%PRED-POST: 37 %
FEV1-%PRED-PRE: 28 %
FEV1-PRE: 0.81 L
FEV1-Post: 1.07 L
FEV1FVC-%Change-Post: 12 %
FEV1FVC-%PRED-PRE: 55 %
FEV6-%Change-Post: 18 %
FEV6-%Pred-Post: 58 %
FEV6-%Pred-Pre: 49 %
FEV6-Post: 2.07 L
FEV6-Pre: 1.75 L
FEV6FVC-%Change-Post: 0 %
FEV6FVC-%PRED-PRE: 98 %
FEV6FVC-%Pred-Post: 98 %
FVC-%Change-Post: 17 %
FVC-%Pred-Post: 59 %
FVC-%Pred-Pre: 51 %
FVC-Post: 2.16 L
FVC-Pre: 1.84 L
POST FEV1/FVC RATIO: 50 %
PRE FEV6/FVC RATIO: 95 %
Post FEV6/FVC ratio: 96 %
Pre FEV1/FVC ratio: 44 %

## 2014-02-25 NOTE — Assessment & Plan Note (Signed)
-   PFTs 01/13/13 with flat insp loop and abn exp loop as well with fev1  1.10 (35%) and ratio 47 - 01/25/2014  Walked RA x 3 laps @ 185 ft each stopped due to  Fatigue / not sob, no tachycardia/no desat  - 01/25/2014 p extensive coaching HFA effectiveness =   75%  - spirometry 01/25/14  FEV1  0.87 (32%) ratio 53 with abn f/v but no flattening  - PFTs 02/25/2014 FEV1 1.07 (37%) ratio 50 p 31% improvement from saba    Denies any symptomatic benefit despite 31% improvement here (260cc) and can afford her cigs but not her meds so nothing more to offer her.     I emphasized that although we never turn away smokers from the pulmonary clinic, we do ask that they understand that the recommendations that we make  won't work nearly as well in the presence of continued cigarette exposure.  In fact, we may very well  reach a point where we can't promise to help the patient if he/she can't quit smoking. (We can and will promise to try to help, we just can't promise what we recommend will really work)   For now can just use the saba prn and call if it stops working

## 2014-02-25 NOTE — Assessment & Plan Note (Signed)
Try off acei 01/11/14 due to ? Pseudoasthma    Would avoid acei here but defer to Dr Ronnald Ramp on final choice for rx  I gave her another 4 weeks samples of bystolic 5 mg one qd

## 2014-02-25 NOTE — Progress Notes (Signed)
Subjective:    Patient ID: Melanie Santiago, female    DOB: [birth year]  MRN: [id number]    Brief patient profile:  22 yowf active smoker with freq bronchial infections starting in her 50s gradually more frequent and then progressive sob since around 2012 with pfts are c/w GOLD III COPD referred by Dr Particia Nearing to pulmonary clinic 01/11/2014     History of Present Illness  01/11/2014 1st [facility] office visit/ Wert Chief Complaint  Patient presents with  . Pulmonary Consult    Referred by Particia Nearing, PA for COPD. Pt c/o dizziness this morning and mild prod cough with green mucous.   does ok walking flat indoors still able to do [employer]  But it's a struggle even on advair 500 and acei and green sputum most ams. rec dulera 200 Take 2 puffs first thing in am and then another 2 puffs about 12 hours later.  Work on inhaler technique:   Only use your nebulizer as a rescue medication   Stop advair and lisinopril Start bystolic 5 mg dialy  Prednisone 10 mg take  4 each am x 2 days,   2 each am x 2 days,  1 each am x 2 days and stop  If still green sputum when you finish cipro > Augmentin 875 mg take one pill twice daily  X 10 days   Please schedule a follow up office visit in 2 weeks, sooner if needed with all active medications in hand    01/25/2014 f/u ov/Wert re: no meds, not even rescue on hand/ still smoking  Chief Complaint  Patient presents with  . Follow-up    Pt states that her breathing and cough is unchanged since the last visit. She uses neb 4-5 times per wk. She states that she did not take the augmentin that we prescribed b/c she is allergic.   no change green mucus p cipro Still struggles to walk on flat surfaces like [employer] s sob p ever aisle Not clearly less cough on dulera 200  Not clearly less need for saba since on dulera  rec You are unlikely to be able physical labor based on your lung function Your breathing and coughing should improve if you stay on  dulera 100 Take 2 puffs first thing in am and then another 2 puffs about 12 hours later.  Work on inhaler technique:    Only use your albuterol as a rescue medication  Stay on bystolic 5 mg daily  Take delsym two tsp every 12 hours and supplement if needed with  tramadol 50 mg up to 2 every 4 hours    Prilosec is 20 mg Take 30-60 min before first meal of the day      02/25/2014 f/u ov/Wert re: still smoking, not convinced any resp meds helps Chief Complaint  Patient presents with  . Follow-up    f/u wiht pft - c/o sob, runny nose, chest and nasal congestion, prod cough (Yellow) - Denies fever     No obvious day to day or daytime variabilty or assoc  cp or chest tightness, subjective wheeze overt sinus or hb symptoms. No unusual exp hx or h/o childhood pna/ asthma or knowledge of premature birth.  Sleeping ok without nocturnal  or early am exacerbation  of respiratory  c/o's or need for noct saba. Also denies any obvious fluctuation of symptoms with weather or environmental changes or other aggravating or alleviating factors except as outlined above   Current Medications,  Allergies, Complete Past Medical History, Past Surgical History, Family History, and Social History were reviewed in [employer] record.  ROS  The following are not active complaints unless bolded sore throat, dysphagia, dental problems, itching, sneezing,  nasal congestion or excess/ purulent secretions, ear ache,   fever, chills, sweats, unintended wt loss, pleuritic or exertional cp, hemoptysis,  orthopnea pnd or leg swelling, presyncope, palpitations, heartburn, abdominal pain, anorexia, nausea, vomiting, diarrhea  or change in bowel or urinary habits, change in stools or urine, dysuria,hematuria,  rash, arthralgias, visual complaints, headache, numbness weakness or ataxia or problems with walking or coordination,  change in mood/affect or memory.                       Objective:    Physical Exam  8/11/`5           124 > 02/25/2014  123  Wt Readings from Last 3 Encounters:  01/11/14 126 lb (57.153 kg)  06/29/11 138 lb (62.596 kg)  10/10/09 141 lb (63.957 kg)     amb wf with no longer prominent pseudowheeze but hopeless/ helpless affect   HEENT: nl dentition, turbinates, and orophanx. Nl external ear canals without cough reflex   NECK :  without JVD/Nodes/TM/ nl carotid upstrokes bilaterally   LUNGS: no acc muscle use, min bilateral late exp rhonchi with mod increased Texp   CV:  RRR  no s3 or murmur or increase in P2, no edema   ABD:  soft and nontender with nl excursion in the supine position. No bruits or organomegaly, bowel sounds nl  MS:  warm without deformities, calf tenderness, cyanosis or clubbing  SKIN: warm and dry without lesions            CXR  01/25/2014 : No acute cardiopulmonary disease.  Suggestion mild COPD.       Assessment & Plan:

## 2014-02-25 NOTE — Progress Notes (Signed)
Spirometry pre and post done today. 

## 2014-02-25 NOTE — Patient Instructions (Addendum)
bystolic 5 mg daily samples until you see Dr Ronnald Ramp  Stop all inhalers and just use  the nebulizer up to every  4 hours as needed when you're short of breath  - if not longer effective, we need to see you right away  The key is to stop smoking completely before smoking completely stops you!   No regular pulmonary follow up needed

## 2014-05-23 ENCOUNTER — Emergency Department (HOSPITAL_COMMUNITY)
Admission: EM | Admit: 2014-05-23 | Discharge: 2014-05-23 | Disposition: A | Discharge status: Home / Self Care | Payer: BC Managed Care – PPO | Attending: Emergency Medicine | Admitting: Emergency Medicine

## 2014-05-23 ENCOUNTER — Emergency Department (HOSPITAL_COMMUNITY): Payer: BC Managed Care – PPO

## 2014-05-23 ENCOUNTER — Encounter (HOSPITAL_COMMUNITY): Payer: Self-pay | Admitting: Family Medicine

## 2014-05-23 DIAGNOSIS — J449 Chronic obstructive pulmonary disease, unspecified: Secondary | ICD-10-CM | POA: Insufficient documentation

## 2014-05-23 DIAGNOSIS — Z880 Allergy status to penicillin: Secondary | ICD-10-CM | POA: Diagnosis not present

## 2014-05-23 DIAGNOSIS — R079 Chest pain, unspecified: Secondary | ICD-10-CM

## 2014-05-23 DIAGNOSIS — Z720 Tobacco use: Secondary | ICD-10-CM | POA: Diagnosis not present

## 2014-05-23 DIAGNOSIS — Z79899 Other long term (current) drug therapy: Secondary | ICD-10-CM | POA: Insufficient documentation

## 2014-05-23 DIAGNOSIS — R091 Pleurisy: Secondary | ICD-10-CM | POA: Diagnosis present

## 2014-05-23 DIAGNOSIS — I10 Essential (primary) hypertension: Secondary | ICD-10-CM | POA: Insufficient documentation

## 2014-05-23 LAB — CBC WITH DIFFERENTIAL/PLATELET
Basophils Absolute: 0 10*3/uL (ref 0.0–0.1)
Basophils Relative: 0 % (ref 0–1)
EOS ABS: 0.1 10*3/uL (ref 0.0–0.7)
EOS PCT: 2 % (ref 0–5)
HCT: 36.8 % (ref 36.0–46.0)
HEMOGLOBIN: 12.5 g/dL (ref 12.0–15.0)
Lymphocytes Relative: 17 % (ref 12–46)
Lymphs Abs: 1.3 10*3/uL (ref 0.7–4.0)
MCH: 30.3 pg (ref 26.0–34.0)
MCHC: 34 g/dL (ref 30.0–36.0)
MCV: 89.3 fL (ref 78.0–100.0)
MONOS PCT: 8 % (ref 3–12)
Monocytes Absolute: 0.6 10*3/uL (ref 0.1–1.0)
Neutro Abs: 5.3 10*3/uL (ref 1.7–7.7)
Neutrophils Relative %: 72 % (ref 43–77)
Platelets: 205 10*3/uL (ref 150–400)
RBC: 4.12 MIL/uL (ref 3.87–5.11)
RDW: 20.3 % — ABNORMAL HIGH (ref 11.5–15.5)
WBC: 7.3 10*3/uL (ref 4.0–10.5)

## 2014-05-23 LAB — I-STAT CHEM 8, ED
BUN: 20 mg/dL (ref 6–23)
CREATININE: 0.8 mg/dL (ref 0.50–1.10)
Calcium, Ion: 1.15 mmol/L (ref 1.12–1.23)
Chloride: 104 mEq/L (ref 96–112)
GLUCOSE: 105 mg/dL — AB (ref 70–99)
HCT: 39 % (ref 36.0–46.0)
HEMOGLOBIN: 13.3 g/dL (ref 12.0–15.0)
Potassium: 3.8 mEq/L (ref 3.7–5.3)
Sodium: 140 mEq/L (ref 137–147)
TCO2: 27 mmol/L (ref 0–100)

## 2014-05-23 LAB — I-STAT TROPONIN, ED: Troponin i, poc: 0 ng/mL (ref 0.00–0.08)

## 2014-05-23 MED ORDER — PREDNISONE 20 MG PO TABS: 40.0000 mg | ORAL_TABLET | Freq: Every day | ORAL | Status: DC

## 2014-05-23 MED ORDER — PREDNISONE 20 MG PO TABS
40.0000 mg | ORAL_TABLET | Freq: Once | ORAL | Status: AC
  Administered 2014-05-23: 40 mg via ORAL
  Filled 2014-05-23: qty 2

## 2014-05-23 MED ORDER — HYDROMORPHONE HCL 1 MG/ML IJ SOLN
1.0000 mg | Freq: Once | INTRAMUSCULAR | Status: AC
  Administered 2014-05-23: 1 mg via INTRAVENOUS
  Filled 2014-05-23: qty 1

## 2014-05-23 MED ORDER — TRAMADOL HCL 50 MG PO TABS
100.0000 mg | ORAL_TABLET | Freq: Once | ORAL | Status: AC
  Administered 2014-05-23: 100 mg via ORAL
  Filled 2014-05-23: qty 2

## 2014-05-23 NOTE — Discharge Instructions (Signed)
Chest Pain (Nonspecific) °It is often hard to give a specific diagnosis for the cause of chest pain. There is always a chance that your pain could be related to something serious, such as a heart attack or a blood clot in the lungs. You need to follow up with your health care provider for further evaluation. °CAUSES  °· Heartburn. °· Pneumonia or bronchitis. °· Anxiety or stress. °· Inflammation around your heart (pericarditis) or lung (pleuritis or pleurisy). °· A blood clot in the lung. °· A collapsed lung (pneumothorax). It can develop suddenly on its own (spontaneous pneumothorax) or from trauma to the chest. °· Shingles infection (herpes zoster virus). °The chest wall is composed of bones, muscles, and cartilage. Any of these can be the source of the pain. °· The bones can be bruised by injury. °· The muscles or cartilage can be strained by coughing or overwork. °· The cartilage can be affected by inflammation and become sore (costochondritis). °DIAGNOSIS  °Lab tests or other studies may be needed to find the cause of your pain. Your health care provider may have you take a test called an ambulatory electrocardiogram (ECG). An ECG records your heartbeat patterns over a 24-hour period. You may also have other tests, such as: °· Transthoracic echocardiogram (TTE). During echocardiography, sound waves are used to evaluate how blood flows through your heart. °· Transesophageal echocardiogram (TEE). °· Cardiac monitoring. This allows your health care provider to monitor your heart rate and rhythm in real time. °· Holter monitor. This is a portable device that records your heartbeat and can help diagnose heart arrhythmias. It allows your health care provider to track your heart activity for several days, if needed. °· Stress tests by exercise or by giving medicine that makes the heart beat faster. °TREATMENT  °· Treatment depends on what may be causing your chest pain. Treatment may include: °· Acid blockers for  heartburn. °· Anti-inflammatory medicine. °· Pain medicine for inflammatory conditions. °· Antibiotics if an infection is present. °· You may be advised to change lifestyle habits. This includes stopping smoking and avoiding alcohol, caffeine, and chocolate. °· You may be advised to keep your head raised (elevated) when sleeping. This reduces the chance of acid going backward from your stomach into your esophagus. °Most of the time, nonspecific chest pain will improve within 2-3 days with rest and mild pain medicine.  °HOME CARE INSTRUCTIONS  °· If antibiotics were prescribed, take them as directed. Finish them even if you start to feel better. °· For the next few days, avoid physical activities that bring on chest pain. Continue physical activities as directed. °· Do not use any tobacco products, including cigarettes, chewing tobacco, or electronic cigarettes. °· Avoid drinking alcohol. °· Only take medicine as directed by your health care provider. °· Follow your health care provider's suggestions for further testing if your chest pain does not go away. °· Keep any follow-up appointments you made. If you do not go to an appointment, you could develop lasting (chronic) problems with pain. If there is any problem keeping an appointment, call to reschedule. °SEEK MEDICAL CARE IF:  °1. Your chest pain does not go away, even after treatment. °2. You have a rash with blisters on your chest. °3. You have a fever. °SEEK IMMEDIATE MEDICAL CARE IF:  °· You have increased chest pain or pain that spreads to your arm, neck, jaw, back, or abdomen. °· You have shortness of breath. °· You have an increasing cough, or you cough   up blood. °· You have severe back or abdominal pain. °· You feel nauseous or vomit. °· You have severe weakness. °· You faint. °· You have chills. °This is an emergency. Do not wait to see if the pain will go away. Get medical help at once. Call your local emergency services (911 in [place] Do not drive  yourself to the hospital. °MAKE SURE YOU:  °· Understand these instructions. °· Will watch your condition. °· Will get help right away if you are not doing well or get worse. °Document Released: 03/13/2005 Document Revised: 06/08/2013 Document Reviewed: 01/07/2008 °ExitCare® Patient Information ©2015 [employer]. This information is not intended to replace advice given to you by your health care provider. Make sure you discuss any questions you have with your health care provider. ° °Chronic Obstructive Pulmonary Disease °Chronic obstructive pulmonary disease (COPD) is a common lung condition in which airflow from the lungs is limited. COPD is a general term that can be used to describe many different lung problems that limit airflow, including both chronic bronchitis and emphysema.  If you have COPD, your lung function will probably never return to normal, but there are measures you can take to improve lung function and make yourself feel better.  °CAUSES  °· Smoking (common).   °· Exposure to secondhand smoke.   °· Genetic problems. °· Chronic inflammatory lung diseases or recurrent infections. °SYMPTOMS  °· Shortness of breath, especially with physical activity.   °· Deep, persistent (chronic) cough with a large amount of thick mucus.   °· Wheezing.   °· Rapid breaths (tachypnea).   °· Gray or bluish discoloration (cyanosis) of the skin, especially in fingers, toes, or lips.   °· Fatigue.   °· Weight loss.   °· Frequent infections or episodes when breathing symptoms become much worse (exacerbations).   °· Chest tightness. °DIAGNOSIS  °Your health care provider will take a medical history and perform a physical examination to make the initial diagnosis.  Additional tests for COPD may include:  °· Lung (pulmonary) function tests. °· Chest X-ray. °· CT scan. °· Blood tests. °TREATMENT  °Treatment available to help you feel better when you have COPD includes:  °· Inhaler and nebulizer medicines. These help manage  the symptoms of COPD and make your breathing more comfortable. °· Supplemental oxygen. Supplemental oxygen is only helpful if you have a low oxygen level in your blood.   °· Exercise and physical activity. These are beneficial for nearly all people with COPD. Some people may also benefit from a pulmonary rehabilitation program. °HOME CARE INSTRUCTIONS  °· Take all medicines (inhaled or pills) as directed by your health care provider. °· Avoid over-the-counter medicines or cough syrups that dry up your airway (such as antihistamines) and slow down the elimination of secretions unless instructed otherwise by your health care provider.   °· If you are a smoker, the most important thing that you can do is stop smoking. Continuing to smoke will cause further lung damage and breathing trouble. Ask your health care provider for help with quitting smoking. He or she can direct you to community resources or hospitals that provide support. °· Avoid exposure to irritants such as smoke, chemicals, and fumes that aggravate your breathing. °· Use oxygen therapy and pulmonary rehabilitation if directed by your health care provider. If you require home oxygen therapy, ask your health care provider whether you should purchase a pulse oximeter to measure your oxygen level at home.   °· Avoid contact with individuals who have a contagious illness. °· Avoid extreme temperature and humidity changes. °·   Eat healthy foods. Eating smaller, more frequent meals and resting before meals may help you maintain your strength. °· Stay active, but balance activity with periods of rest. Exercise and physical activity will help you maintain your ability to do things you want to do. °· Preventing infection and hospitalization is very important when you have COPD. Make sure to receive all the vaccines your health care provider recommends, especially the pneumococcal and influenza vaccines. Ask your health care provider whether you need a pneumonia  vaccine. °· Learn and use relaxation techniques to manage stress. °· Learn and use controlled breathing techniques as directed by your health care provider. Controlled breathing techniques include:   °¨ Pursed lip breathing. Start by breathing in (inhaling) through your nose for 1 second. Then, purse your lips as if you were going to whistle and breathe out (exhale) through the pursed lips for 2 seconds.   °¨ Diaphragmatic breathing. Start by putting one hand on your abdomen just above your waist. Inhale slowly through your nose. The hand on your abdomen should move out. Then purse your lips and exhale slowly. You should be able to feel the hand on your abdomen moving in as you exhale.   °· Learn and use controlled coughing to clear mucus from your lungs. Controlled coughing is a series of short, progressive coughs. The steps of controlled coughing are:   °4. Lean your head slightly forward.   °5. Breathe in deeply using diaphragmatic breathing.   °6. Try to hold your breath for 3 seconds.   °7. Keep your mouth slightly open while coughing twice.   °8. Spit any mucus out into a tissue.   °9. Rest and repeat the steps once or twice as needed. °SEEK MEDICAL CARE IF:  °· You are coughing up more mucus than usual.   °· There is a change in the color or thickness of your mucus.   °· Your breathing is more labored than usual.   °· Your breathing is faster than usual.   °SEEK IMMEDIATE MEDICAL CARE IF:  °· You have shortness of breath while you are resting.   °· You have shortness of breath that prevents you from: °¨ Being able to talk.   °¨ Performing your usual physical activities.   °· You have chest pain lasting longer than 5 minutes.   °· Your skin color is more cyanotic than usual. °· You measure low oxygen saturations for longer than 5 minutes with a pulse oximeter. °MAKE SURE YOU:  °· Understand these instructions. °· Will watch your condition. °· Will get help right away if you are not doing well or get  worse. °Document Released: 03/13/2005 Document Revised: 10/18/2013 Document Reviewed: 01/28/2013 °ExitCare® Patient Information ©2015 [employer]. This information is not intended to replace advice given to you by your health care provider. Make sure you discuss any questions you have with your health care provider. ° °

## 2014-05-23 NOTE — ED Notes (Signed)
Pt comfortable with discharge and follow up instructions. Pt declines wheelchair, escorted to waiting area by this RN. Prescriptions x1. 

## 2014-05-23 NOTE — ED Notes (Signed)
Pt presents from home via [facility]. C/C of CP x2 days with cough x3-4 days with green mucus noted by pt. NSR noted by EMS.  PT reports CP is worse on coughing. CBG 107.

## 2014-05-23 NOTE — ED Provider Notes (Signed)
CSN: [id number]     Arrival date & time 05/23/14  1350 History   First MD Initiated Contact with Patient 05/23/14 1350     Chief Complaint  Patient presents with  . Pleurisy     (Consider location/radiation/quality/duration/timing/severity/associated sxs/prior Treatment) The history is provided by the patient.   patient with history of COPD. Patient with some left-sided chest pain. She states she's been coughing for the last few years. Worse recently. States she's had some green sputum. Has a history of COPD. She continues to smoke. No swelling or legs. Pain is not worse with exertion. She states her back hurts more because of all the coughing. No previous heart attack. No shortness of breath.  Past Medical History  Diagnosis Date  . Back pain, chronic   . Hypertension   . COPD (chronic obstructive pulmonary disease)    Past Surgical History  Procedure Laterality Date  . Abdominal hysterectomy    . Cholecystectomy    . Ovarian cyst removal     No family history on file. History  Substance Use Topics  . Smoking status: Current Every Day Smoker -- 1.50 packs/day for 33 years    Types: Cigarettes  . Smokeless tobacco: Never Used  . Alcohol Use: No   OB History    No data available     Review of Systems  Constitutional: Negative for activity change and appetite change.  Eyes: Negative for pain.  Respiratory: Positive for cough. Negative for chest tightness and shortness of breath.   Cardiovascular: Positive for chest pain. Negative for leg swelling.  Gastrointestinal: Negative for nausea, vomiting, abdominal pain and diarrhea.  Genitourinary: Negative for flank pain.  Musculoskeletal: Positive for back pain. Negative for neck stiffness.  Skin: Negative for rash.  Neurological: Negative for weakness, numbness and headaches.  Psychiatric/Behavioral: Negative for behavioral problems.      Allergies  Augmentin and Sulfa antibiotics  Home Medications   Prior to  Admission medications   Medication Sig Start Date End Date Taking? Authorizing Provider  albuterol (PROVENTIL HFA;VENTOLIN HFA) 108 (90 BASE) MCG/ACT inhaler Inhale 2 puffs into the lungs every 6 (six) hours as needed. For shortness of breath.   Yes Historical Provider, MD  albuterol (PROVENTIL) (2.5 MG/3ML) 0.083% nebulizer solution Take 2.5 mg by nebulization every 6 (six) hours as needed. For shortness of breath.   Yes Historical Provider, MD  ALPRAZolam Duanne Moron) 1 MG tablet Take 1 mg by mouth 2 (two) times daily as needed for anxiety.   Yes Historical Provider, MD  Calcium Carb-Cholecalciferol (CALCIUM-VITAMIN D) 600-400 MG-UNIT TABS Take 1 tablet by mouth 2 (two) times daily.    Yes Historical Provider, MD  estrogen-methylTESTOSTERone (ESTRATEST) 1.25-2.5 MG per tablet Take 1 tablet by mouth daily.   Yes Historical Provider, MD  mometasone-formoterol (DULERA) 100-5 MCG/ACT AERO Inhale 2 puffs into the lungs 2 (two) times daily.   Yes Historical Provider, MD  nebivolol (BYSTOLIC) 5 MG tablet Take 1 tablet (5 mg total) by mouth daily. 01/24/14  Yes Tanda Rockers, MD  omeprazole (PRILOSEC) 20 MG capsule Take 20 mg by mouth daily.   Yes Historical Provider, MD  Oxycodone HCl 10 MG TABS Take 10 mg by mouth every 4 (four) hours as needed (for pain).   Yes Historical Provider, MD  polyethylene glycol (MIRALAX / GLYCOLAX) packet Take 17 g by mouth daily as needed for mild constipation.   Yes Historical Provider, MD  traMADol (ULTRAM) 50 MG tablet Take 50-100 mg by mouth every 6 (  six) hours as needed for moderate pain.   Yes Historical Provider, MD  traZODone (DESYREL) 50 MG tablet Take 50 mg by mouth at bedtime as needed for sleep.   Yes Historical Provider, MD  meclizine (ANTIVERT) 25 MG tablet Take 25 mg by mouth 3 (three) times daily as needed for dizziness.    Historical Provider, MD  NICODERM CQ 21 MG/24HR patch Place onto the skin daily.    Historical Provider, MD  predniSONE (DELTASONE) 20 MG  tablet Take 2 tablets (40 mg total) by mouth daily. 05/24/14   Jasper Riling. Gwynevere Lizana, MD   BP 104/68 mmHg  Pulse 76  Temp(Src) 97.7 F (36.5 C) (Oral)  Resp 18  SpO2 97% Physical Exam  Constitutional: She is oriented to person, place, and time. She appears well-developed and well-nourished.  HENT:  Head: Normocephalic and atraumatic.  Eyes: EOM are normal. Pupils are equal, round, and reactive to light.  Neck: Normal range of motion. Neck supple.  Cardiovascular: Normal rate, regular rhythm and normal heart sounds.   No murmur heard. Pulmonary/Chest: Effort normal and breath sounds normal. No respiratory distress. She has no wheezes. She has no rales. She exhibits tenderness.  Mild tenderness to left lateral anterior chest wall.  Abdominal: Soft. Bowel sounds are normal. She exhibits no distension. There is no tenderness. There is no rebound and no guarding.  Musculoskeletal: Normal range of motion.  Neurological: She is alert and oriented to person, place, and time. No cranial nerve deficit.  Skin: Skin is warm and dry.  Psychiatric: She has a normal mood and affect. Her speech is normal.  Nursing note and vitals reviewed.   ED Course  Procedures (including critical care time) Labs Review Labs Reviewed  CBC WITH DIFFERENTIAL - Abnormal; Notable for the following:    RDW 20.3 (*)    All other components within normal limits  I-STAT CHEM 8, ED - Abnormal; Notable for the following:    Glucose, Bld 105 (*)    All other components within normal limits  I-STAT TROPOININ, ED    Imaging Review No results found.   EKG Interpretation None      Date: 05/23/2014  Rate: *83  Rhythm: normal sinus rhythm  QRS Axis: normal  Intervals: normal  ST/T Wave abnormalities: normal  Conduction Disutrbances:none  Narrative Interpretation:   Old EKG Reviewed: none available   MDM   Final diagnoses:  Chest pain  Chronic obstructive pulmonary disease, unspecified COPD, unspecified  chronic bronchitis type    Patient with some left-sided chest pain. X-ray negative. Doubt PE. Doubt cardiac cause. Will discharge home. Patient has large amounts of pain medicine at home already.    Jasper Riling. Alvino Chapel, MD 05/23/14 [phone]

## 2014-08-17 ENCOUNTER — Telehealth: Payer: Self-pay | Admitting: Internal Medicine

## 2014-08-17 NOTE — Telephone Encounter (Signed)
There isn't anything in chart stating that MW would not see her back. ROV has been scheduled for 10/07/14 at 11am.

## 2014-10-07 ENCOUNTER — Ambulatory Visit: Payer: Self-pay | Admitting: Internal Medicine

## 2014-11-18 ENCOUNTER — Ambulatory Visit (INDEPENDENT_AMBULATORY_CARE_PROVIDER_SITE_OTHER): Payer: BLUE CROSS/BLUE SHIELD | Admitting: Internal Medicine

## 2014-11-18 ENCOUNTER — Encounter (INDEPENDENT_AMBULATORY_CARE_PROVIDER_SITE_OTHER): Payer: Self-pay

## 2014-11-18 ENCOUNTER — Encounter: Payer: Self-pay | Admitting: Internal Medicine

## 2014-11-18 VITALS — BP 100/62 | HR 89 | Ht 65.0 in | Wt 129.0 lb

## 2014-11-18 DIAGNOSIS — F17210 Nicotine dependence, cigarettes, uncomplicated: Secondary | ICD-10-CM

## 2014-11-18 DIAGNOSIS — J449 Chronic obstructive pulmonary disease, unspecified: Secondary | ICD-10-CM

## 2014-11-18 DIAGNOSIS — Z720 Tobacco use: Secondary | ICD-10-CM | POA: Diagnosis not present

## 2014-11-18 MED ORDER — CEFDINIR 300 MG PO CAPS: 300.0000 mg | ORAL_CAPSULE | Freq: Two times a day (BID) | ORAL | Status: DC

## 2014-11-18 MED ORDER — BUDESONIDE-FORMOTEROL FUMARATE 160-4.5 MCG/ACT IN AERO: INHALATION_SPRAY | RESPIRATORY_TRACT | Status: AC

## 2014-11-18 MED ORDER — PREDNISONE 10 MG PO TABS: ORAL_TABLET | ORAL | Status: DC

## 2014-11-18 NOTE — Patient Instructions (Addendum)
omnicef 300 twice daily x 7 days  symbicort 160 Take 2 puffs first thing in am and then another 2 puffs about 12 hours later.   Prednisone 10 mg take  4 each am x 2 days,   2 each am x 2 days,  1 each am x 2 days and stop  Work on inhaler technique:  relax and gently blow all the way out then take a nice smooth deep breath back in, triggering the inhaler at same time you start breathing in.  Hold for up to 5 seconds if you can.  Rinse and gargle with water when done  The key is to stop smoking completely before smoking completely stops you!       See Tammy NP w/in 2 weeks with all your medications, even over the counter meds, separated in two separate bags, the ones you take no matter what vs the ones you stop once you feel better and take only as needed when you feel you need them.   Tammy  will generate for you a new user friendly medication calendar that will put [place] all on the same page re: your medication use.     Without this process, it simply isn't possible to assure that we are providing  your outpatient care  with  the attention to detail we feel you deserve.   If we cannot assure that you're getting that kind of care,  then we cannot manage your problem effectively from this clinic.  Once you have seen Tammy and we are sure that we're all on the same page with your medication use she will arrange follow up with me.  If not able to use hfa change to breo

## 2014-11-18 NOTE — Progress Notes (Signed)
Subjective:    Patient ID: Melanie Santiago, female    DOB: 08/26/1960  MRN: [id number]    Brief patient profile:  28 yowf active smoker with freq bronchial infections starting in her 55s gradually more frequent and then progressive sob since around 2012 with pfts are c/w GOLD III COPD referred by Dr Particia Nearing to pulmonary clinic 01/11/2014     History of Present Illness  01/11/2014 1st [facility] office visit/ Melanie Santiago Chief Complaint  Patient presents with  . Pulmonary Consult    Referred by Particia Nearing, PA for COPD. Pt c/o dizziness this morning and mild prod cough with green mucous.   does ok walking flat indoors still able to do [employer]  But it's a struggle even on advair 500 and acei and green sputum most ams. rec dulera 200 Take 2 puffs first thing in am and then another 2 puffs about 12 hours later.  Work on inhaler technique:   Only use your nebulizer as a rescue medication   Stop advair and lisinopril Start bystolic 5 mg dialy  Prednisone 10 mg take  4 each am x 2 days,   2 each am x 2 days,  1 each am x 2 days and stop  If still green sputum when you finish cipro > Augmentin 875 mg take one pill twice daily  X 10 days   Please schedule a follow up office visit in 2 weeks, sooner if needed with all active medications in hand    01/25/2014 f/u ov/Melanie Santiago re: no meds, not even rescue on hand/ still smoking  Chief Complaint  Patient presents with  . Follow-up    Pt states that her breathing and cough is unchanged since the last visit. She uses neb 4-5 times per wk. She states that she did not take the augmentin that we prescribed b/c she is allergic.   no change green mucus p cipro Still struggles to walk on flat surfaces like [employer] s sob p ever aisle Not clearly less cough on dulera 200  Not clearly less need for saba since on dulera  rec You are unlikely to be able physical labor based on your lung function Your breathing and coughing should improve if you stay on  dulera 100 Take 2 puffs first thing in am and then another 2 puffs about 12 hours later.  Work on inhaler technique:    Only use your albuterol as a rescue medication  Stay on bystolic 5 mg daily  Take delsym two tsp every 12 hours and supplement if needed with  tramadol 50 mg up to 2 every 4 hours    Prilosec is 20 mg Take 30-60 min before first meal of the day      02/25/2014 f/u ov/Melanie Santiago re: still smoking, not convinced any resp meds helps Chief Complaint  Patient presents with  . Follow-up    f/u wiht pft - c/o sob, runny nose, chest and nasal congestion, prod cough (Yellow) - Denies fever  rec Change to nebs prn  since doesn't think inhalers working / work on d/c cigs    11/18/2014 f/u ov/Melanie Santiago re: GOLD III copd/ worse sob x 6 m / still smoking/ not on any maint rx at all as she didn't think they worked/ too expensive   Chief Complaint  Patient presents with  . Follow-up    Pt states breathing is progressively worse since her last visit. She gets SOB walking up stairs and with walking five min on  flat surface.  She is using albuterol inhaler and neb both on average 3 x per day.   only walking slow pace otherwise has to stop before 100 yards = MMRC2 Worsening assoc with nasal congestion/ am purulent mucus and increase need for saba in all forms   No obvious day to day or daytime variabilty or assoc cp or chest tightness, subjective wheeze overt   hb symptoms. No unusual exp hx or h/o childhood pna/ asthma or knowledge of premature birth.    Sleeping ok without nocturnal  Exacerbation of respiratory  c/o's or need for noct saba. Also denies any obvious fluctuation of symptoms with weather or environmental changes or other aggravating or alleviating factors except as outlined above   Current Medications, Allergies, Complete Past Medical History, Past Surgical History, Family History, and Social History were reviewed in [employer] record.  ROS  The following are  not active complaints unless bolded sore throat, dysphagia, dental problems, itching, sneezing,  nasal congestion or excess/ purulent secretions, ear ache,   fever, chills, sweats, unintended wt loss, pleuritic or exertional cp, hemoptysis,  orthopnea pnd or leg swelling, presyncope, palpitations, heartburn, abdominal pain, anorexia, nausea, vomiting, diarrhea  or change in bowel or urinary habits, change in stools or urine, dysuria,hematuria,  rash, arthralgias, visual complaints, headache, numbness weakness or ataxia or problems with walking or coordination,  change in mood/affect or memory.                       Objective:   Physical Exam  8/11/`5           124 > 02/25/2014  123  > 11/18/2014  129  Wt Readings from Last 3 Encounters:  01/11/14 126 lb (57.153 kg)  06/29/11 138 lb (62.596 kg)  10/10/09 141 lb (63.957 kg)     amb wf with no longer prominent pseudowheeze but hopeless/ helpless affect   HEENT: nl dentition, turbinates, and orophanx. Nl external ear canals without cough reflex   NECK :  without JVD/Nodes/TM/ nl carotid upstrokes bilaterally   LUNGS: no acc muscle use, min bilateral late exp rhonchi     CV:  RRR  no s3 or murmur or increase in P2, no edema   ABD:  soft and nontender with nl excursion in the supine position. No bruits or organomegaly, bowel sounds nl  MS:  warm without deformities, calf tenderness, cyanosis or clubbing  SKIN: warm and dry without lesions            I personally reviewed images and agree with radiology impression as follows:  CXR:  12 /7/16   Bronchitic changes. Probable emphysema.      Assessment & Plan:

## 2014-11-19 ENCOUNTER — Encounter: Payer: Self-pay | Admitting: Internal Medicine

## 2014-11-19 NOTE — Assessment & Plan Note (Signed)
-   PFts 01/13/13 with flat insp loop and abn exp loop as well with fev1  1.10 (35%) and ratio 47 - 01/25/2014  Walked RA x 3 laps @ 185 ft each stopped due to  Fatigue / not sob, no tachycardia/no desat  - 01/25/2014 p extensive coaching HFA effectiveness =   75%  - spirometry 01/25/14  FEV1  0.87 (32%) ratio 53 with abn f/v but no flattening  - PFTs 02/25/2014 FEV1 1.07 (37%) ratio 50 p 31% improvement from saba   - 11/18/2014 p extensive coaching HFA effectiveness =    75% > restarted symbicort 160 2bid    I had an extended discussion with the patient reviewing all relevant studies completed to date and  lasting 15 to 20 minutes of a 25 minute visit on the following ongoing concerns:   1) clearly there is a reversible component here but she needs to learn better how / when to use her meds and not smoke (discussed sep)  2) desperately needs med reconciliation.  To keep things simple, I have asked the patient to first separate medicines that are perceived as maintenance, that is to be taken daily "no matter what", from those medicines that are taken on only on an as-needed basis and I have given the patient examples of both, and then return to see our NP to generate a  detailed  medication calendar which should be followed until the next physician sees the patient and updates it.  3) for now best choice is symbicort 25 dollar plan but if can't inhale consistently on return may try BREO which has similar plan  4) Each maintenance medication was reviewed in detail including most importantly the difference between maintenance and as needed and under what circumstances the prns are to be used.  Please see instructions for details which were reviewed in writing and the patient given a copy.

## 2014-11-19 NOTE — Assessment & Plan Note (Signed)

## 2014-12-05 ENCOUNTER — Encounter: Payer: Self-pay | Admitting: Adult Health

## 2015-06-01 ENCOUNTER — Emergency Department (HOSPITAL_COMMUNITY)
Admission: EM | Admit: 2015-06-01 | Discharge: 2015-06-01 | Disposition: A | Discharge status: Home / Self Care | Payer: Disability Insurance | Attending: Emergency Medicine | Admitting: Emergency Medicine

## 2015-06-01 ENCOUNTER — Encounter (HOSPITAL_COMMUNITY): Payer: Self-pay | Admitting: Emergency Medicine

## 2015-06-01 DIAGNOSIS — Z793 Long term (current) use of hormonal contraceptives: Secondary | ICD-10-CM | POA: Insufficient documentation

## 2015-06-01 DIAGNOSIS — I10 Essential (primary) hypertension: Secondary | ICD-10-CM | POA: Insufficient documentation

## 2015-06-01 DIAGNOSIS — R21 Rash and other nonspecific skin eruption: Secondary | ICD-10-CM

## 2015-06-01 DIAGNOSIS — J449 Chronic obstructive pulmonary disease, unspecified: Secondary | ICD-10-CM | POA: Insufficient documentation

## 2015-06-01 DIAGNOSIS — Z7951 Long term (current) use of inhaled steroids: Secondary | ICD-10-CM | POA: Insufficient documentation

## 2015-06-01 DIAGNOSIS — L988 Other specified disorders of the skin and subcutaneous tissue: Secondary | ICD-10-CM | POA: Insufficient documentation

## 2015-06-01 DIAGNOSIS — G8929 Other chronic pain: Secondary | ICD-10-CM | POA: Insufficient documentation

## 2015-06-01 DIAGNOSIS — F17210 Nicotine dependence, cigarettes, uncomplicated: Secondary | ICD-10-CM | POA: Insufficient documentation

## 2015-06-01 DIAGNOSIS — Z79899 Other long term (current) drug therapy: Secondary | ICD-10-CM | POA: Insufficient documentation

## 2015-06-01 DIAGNOSIS — L299 Pruritus, unspecified: Secondary | ICD-10-CM | POA: Insufficient documentation

## 2015-06-01 MED ORDER — OXYCODONE-ACETAMINOPHEN 5-325 MG PO TABS: 2.0000 | ORAL_TABLET | ORAL | Status: DC | PRN

## 2015-06-01 MED ORDER — HYDROXYZINE HCL 25 MG PO TABS: 25.0000 mg | ORAL_TABLET | Freq: Four times a day (QID) | ORAL | Status: DC | PRN

## 2015-06-01 MED ORDER — OXYCODONE-ACETAMINOPHEN 5-325 MG PO TABS: 1.0000 | ORAL_TABLET | ORAL | Status: DC | PRN

## 2015-06-01 NOTE — ED Notes (Signed)
Pt reports blisters on left anterior forearm with drainage x3 weeks.  Pt reports severe pain.

## 2015-06-01 NOTE — ED Provider Notes (Signed)
CSN: [id number]     Arrival date & time 06/01/15  1810 History   First MD Initiated Contact with Patient 06/01/15 1926     Chief Complaint  Patient presents with  . Herpes Zoster     (Consider location/radiation/quality/duration/timing/severity/associated sxs/prior Treatment) HPI   54 year old female who is paralyzed stress reveals slightly secondary to the death of her husband presented to the emergency room today with a rash on her left forearm. Bullous with surrounding erythema cross in multiple dermatomes. Pain and itching. They going on for approximately a week. Initially did better with hydrocortisone over the last 3 days is progressively worsening. No rash anywhere else on her whole body.  Past Medical History  Diagnosis Date  . Back pain, chronic   . Hypertension   . COPD (chronic obstructive pulmonary disease) [facility])    Past Surgical History  Procedure Laterality Date  . Abdominal hysterectomy    . Cholecystectomy    . Ovarian cyst removal     History reviewed. No pertinent family history. Social History  Substance Use Topics  . Smoking status: Current Every Day Smoker -- 0.50 packs/day for 33 years    Types: Cigarettes  . Smokeless tobacco: Never Used  . Alcohol Use: No   OB History    No data available     Review of Systems  Constitutional: Negative for fever and chills.  HENT: Negative for hearing loss, rhinorrhea and sneezing.   Eyes: Negative for photophobia and pain.  Respiratory: Negative for cough and shortness of breath.   Genitourinary: Negative for frequency and flank pain.  Skin: Positive for rash.  Neurological: Negative for syncope and weakness.      Allergies  Augmentin and Sulfa antibiotics  Home Medications   Prior to Admission medications   Medication Sig Start Date End Date Taking? Authorizing Provider  albuterol (PROVENTIL HFA;VENTOLIN HFA) 108 (90 BASE) MCG/ACT inhaler Inhale 2 puffs into the lungs every 6 (six) hours as needed.  For shortness of breath.   Yes Historical Provider, MD  albuterol (PROVENTIL) (2.5 MG/3ML) 0.083% nebulizer solution Take 2.5 mg by nebulization every 6 (six) hours as needed. For shortness of breath.   Yes Historical Provider, MD  ALPRAZolam Duanne Moron) 1 MG tablet Take 1 mg by mouth 2 (two) times daily as needed for anxiety.   Yes Historical Provider, MD  budesonide-formoterol (SYMBICORT) 160-4.5 MCG/ACT inhaler Take 2 puffs first thing in am and then another 2 puffs about 12 hours later. Patient taking differently: Inhale 2 puffs into the lungs 2 (two) times daily. Take 2 puffs first thing in am and then another 2 puffs about 12 hours later. 11/18/14  Yes Tanda Rockers, MD  Calcium Carb-Cholecalciferol (CALCIUM-VITAMIN D) 600-400 MG-UNIT TABS Take 1 tablet by mouth 2 (two) times daily.    Yes Historical Provider, MD  estradiol (ESTRACE) 2 MG tablet Take 2 mg by mouth daily.   Yes Historical Provider, MD  ipratropium-albuterol (DUONEB) 0.5-2.5 (3) MG/3ML SOLN Take 3 mLs by nebulization every 6 (six) hours as needed (for shortness of breath).   Yes Historical Provider, MD  nebivolol (BYSTOLIC) 5 MG tablet Take 1 tablet (5 mg total) by mouth daily. 01/24/14  Yes Tanda Rockers, MD  omeprazole (PRILOSEC) 20 MG capsule Take 20 mg by mouth daily.   Yes Historical Provider, MD  Oxycodone HCl 10 MG TABS Take 10 mg by mouth every 4 (four) hours as needed (for pain).   Yes Historical Provider, MD  hydrOXYzine (ATARAX/VISTARIL) 25 MG  tablet Take 1 tablet (25 mg total) by mouth every 6 (six) hours as needed for itching. 06/01/15   Merrily Pew, MD  oxyCODONE-acetaminophen (PERCOCET) 5-325 MG tablet Take 1 tablet by mouth every 4 (four) hours as needed. 06/01/15   Merrily Pew, MD  oxyCODONE-acetaminophen (PERCOCET/ROXICET) 5-325 MG tablet Take 2 tablets by mouth every 4 (four) hours as needed for severe pain. 06/01/15   Merrily Pew, MD   BP 152/82 mmHg  Pulse 68  Temp(Src) 98.5 F (36.9 C) (Oral)  Resp 20  Ht  5\' 5"  (1.651 m)  Wt 107 lb (48.535 kg)  BMI 17.81 kg/m2  SpO2 99% Physical Exam  Constitutional: She is oriented to person, place, and time. She appears well-developed and well-nourished.  HENT:  Head: Normocephalic and atraumatic.  Eyes: Conjunctivae are normal. Pupils are equal, round, and reactive to light.  Neck: Normal range of motion.  Cardiovascular: Normal rate and regular rhythm.   Pulmonary/Chest: Effort normal. No stridor. No respiratory distress.  Abdominal: She exhibits no distension.  Musculoskeletal: Normal range of motion. She exhibits no edema or tenderness.  Neurological: She is alert and oriented to person, place, and time.  Skin: Rash (bullous with underlying erythema) noted.     Nursing note and vitals reviewed.   ED Course  Procedures (including critical care time) Labs Review Labs Reviewed - No data to display  Imaging Review No results found. I have personally reviewed and evaluated these images and lab results as part of my medical decision-making.   EKG Interpretation None      MDM   Final diagnoses:  Rash    54 year old female who is paralyzed stress reveals slightly secondary to the death of her husband presented to the emergency room today with a rash on her left forearm. Bullous with surrounding erythema cross in multiple dermatomes. Pain and itching. They going on for approximately a week. Initially did better with hydrocortisone over the last 3 days is progressively worsening. No rash anywhere else on her whole body.  exam wholly unremarkable aside from the rash. Unsure of the cause. I don't think she has shingles as it isn't vesicular nature to more consistent with bullae. The patient is erythema nodosum. Doubt cellulitis. With only been located in that one area also doubt any significant systemic disease. I will treat her symptomatically now with antipruritic and anti- pain indications have her follow-up with dermatology.     Merrily Pew, MD 06/01/15 [phone]

## 2015-06-06 MED FILL — Oxycodone w/ Acetaminophen Tab 5-325 MG: ORAL | Qty: 6 | Status: AC

## 2015-06-08 ENCOUNTER — Ambulatory Visit: Payer: Self-pay | Admitting: Internal Medicine

## 2015-07-05 ENCOUNTER — Encounter: Payer: Self-pay | Admitting: Internal Medicine

## 2015-07-05 ENCOUNTER — Ambulatory Visit (INDEPENDENT_AMBULATORY_CARE_PROVIDER_SITE_OTHER): Payer: Self-pay | Admitting: Internal Medicine

## 2015-07-05 ENCOUNTER — Telehealth: Payer: Self-pay | Admitting: Internal Medicine

## 2015-07-05 DIAGNOSIS — J449 Chronic obstructive pulmonary disease, unspecified: Secondary | ICD-10-CM

## 2015-07-05 MED ORDER — ALBUTEROL SULFATE HFA 108 (90 BASE) MCG/ACT IN AERS: 2.0000 | INHALATION_SPRAY | Freq: Four times a day (QID) | RESPIRATORY_TRACT | Status: AC | PRN

## 2015-07-05 MED ORDER — OMEPRAZOLE 20 MG PO CPDR: 20.0000 mg | DELAYED_RELEASE_CAPSULE | Freq: Two times a day (BID) | ORAL | Status: DC

## 2015-07-05 NOTE — Patient Instructions (Addendum)
Change the omeprazole and take Take 30- 60 min before your first and last meals of the day   GERD (REFLUX)  is an extremely common cause of respiratory symptoms just like yours , many times with no obvious heartburn at all.    It can be treated with medication, but also with lifestyle changes including elevation of the head of your bed (ideally with 6 inch  bed blocks),  Smoking cessation, avoidance of late meals, excessive alcohol, and avoid fatty foods, chocolate, peppermint, colas, red wine, and acidic juices such as orange juice.  NO MINT OR MENTHOL PRODUCTS SO NO COUGH DROPS  USE SUGARLESS CANDY INSTEAD (Jolley ranchers or Stover's or Life Savers) or even ice chips will also do - the key is to swallow to prevent all throat clearing. NO OIL BASED VITAMINS - use powdered substitutes.    The key is to stop smoking completely before smoking completely stops you!   For cough > mucinex dm 1200 mg every 12 hours as needed and ok to supplement with cough medicine or oxycodone  Plan A = symbicort 160 Take 2 puffs first thing in am and then another 2 puffs about 12 hours later.   Work on inhaler technique:  relax and gently blow all the way out then take a nice smooth deep breath back in, triggering the inhaler at same time you start breathing in.  Hold for up to 5 seconds if you can. Blow out thru nose. Rinse and gargle with water when done    Plan B = Backup Only use your albuterol as a rescue medication to be used if you can't catch your breath by resting or doing a relaxed purse lip breathing pattern.  - The less you use it, the better it will work when you need it. - Ok to use up to 2 puffs  every 4 hours if you must but call for immediate appointment if use goes up over your usual need - Don't leave home without it !!  (think of it like the spare tire for your car)   Plan  C = Crisis Only use the nebulizer if you try the inhaler first and it doesn't work to your satisfaction    If not  better :  See Tammy NP   with all your medications, even over the counter meds, separated in two separate bags, the ones you take no matter what vs the ones you stop once you feel better and take only as needed when you feel you need them.   Tammy  will generate for you a new user friendly medication calendar that will put [place] all on the same page re: your medication use.     Without this process, it simply isn't possible to assure that we are providing  your outpatient care  with  the attention to detail we feel you deserve.   If we cannot assure that you're getting that kind of care,  then we cannot manage your problem effectively from this clinic.  Once you have seen Tammy and we are sure that we're all on the same page with your medication use she will arrange follow up with me.

## 2015-07-05 NOTE — Telephone Encounter (Signed)
Spoke with pt, requesting today's fully dictated OV note be mailed to pt-verified address on file.  This has been printed and placed in outgoing mail.  Nothing further needed.

## 2015-07-05 NOTE — Telephone Encounter (Signed)
Patient returned call, CB [phone]

## 2015-07-05 NOTE — Progress Notes (Signed)
Subjective:    Patient ID: Melanie Santiago, female    DOB: 08/26/1960  MRN: [id number]    Brief patient profile:  28 yowf active smoker with freq bronchial infections starting in her 55s gradually more frequent and then progressive sob since around 2012 with pfts are c/w GOLD III COPD referred by Dr Particia Nearing to pulmonary clinic 01/11/2014     History of Present Illness  01/11/2014 1st [facility] office visit/ Earlin Sweeden Chief Complaint  Patient presents with  . Pulmonary Consult    Referred by Particia Nearing, PA for COPD. Pt c/o dizziness this morning and mild prod cough with green mucous.   does ok walking flat indoors still able to do [employer]  But it's a struggle even on advair 500 and acei and green sputum most ams. rec dulera 200 Take 2 puffs first thing in am and then another 2 puffs about 12 hours later.  Work on inhaler technique:   Only use your nebulizer as a rescue medication   Stop advair and lisinopril Start bystolic 5 mg dialy  Prednisone 10 mg take  4 each am x 2 days,   2 each am x 2 days,  1 each am x 2 days and stop  If still green sputum when you finish cipro > Augmentin 875 mg take one pill twice daily  X 10 days   Please schedule a follow up office visit in 2 weeks, sooner if needed with all active medications in hand    01/25/2014 f/u ov/Nocole Zammit re: no meds, not even rescue on hand/ still smoking  Chief Complaint  Patient presents with  . Follow-up    Pt states that her breathing and cough is unchanged since the last visit. She uses neb 4-5 times per wk. She states that she did not take the augmentin that we prescribed b/c she is allergic.   no change green mucus p cipro Still struggles to walk on flat surfaces like [employer] s sob p ever aisle Not clearly less cough on dulera 200  Not clearly less need for saba since on dulera  rec You are unlikely to be able physical labor based on your lung function Your breathing and coughing should improve if you stay on  dulera 100 Take 2 puffs first thing in am and then another 2 puffs about 12 hours later.  Work on inhaler technique:    Only use your albuterol as a rescue medication  Stay on bystolic 5 mg daily  Take delsym two tsp every 12 hours and supplement if needed with  tramadol 50 mg up to 2 every 4 hours    Prilosec is 20 mg Take 30-60 min before first meal of the day      02/25/2014 f/u ov/Irie Fiorello re: still smoking, not convinced any resp meds helps Chief Complaint  Patient presents with  . Follow-up    f/u wiht pft - c/o sob, runny nose, chest and nasal congestion, prod cough (Yellow) - Denies fever  rec Change to nebs prn  since doesn't think inhalers working / work on d/c cigs    11/18/2014 f/u ov/Jenese Mischke re: GOLD III copd/ worse sob x 6 m / still smoking/ not on any maint rx at all as she didn't think they worked/ too expensive   Chief Complaint  Patient presents with  . Follow-up    Pt states breathing is progressively worse since her last visit. She gets SOB walking up stairs and with walking five min on  flat surface.  She is using albuterol inhaler and neb both on average 3 x per day.   only walking slow pace otherwise has to stop before 100 yards = MMRC2 Worsening assoc with nasal congestion/ am purulent mucus and increase need for saba in all forms rec omnicef 300 twice daily x 7 days symbicort 160 Take 2 puffs first thing in am and then another 2 puffs about 12 hours later.  Prednisone 10 mg take  4 each am x 2 days,   2 each am x 2 days,  1 each am x 2 days and stop Work on inhaler technique    07/05/2015  f/u ov/Mariaeduarda Defranco re: GOLD III  Chief Complaint  Patient presents with  . Acute Visit    Pt c/o increased SOB, wheezing and cough with yellow sputum for the past month.  She is using neb and albuterol inhaler both 4 x daily on average.   already rx abx x 2 and pred by Particia Nearing helped some / still smoking / really unclear what meds she takes "I just ran out - they are at the  drugstore to pick up"     No obvious day to day or daytime variabilty or assoc cp or chest tightness,  overt   hb symptoms. No unusual exp hx or h/o childhood pna/ asthma or knowledge of premature birth.    Sleeping ok without nocturnal  Exacerbation of respiratory  c/o's or need for noct saba. Also denies any obvious fluctuation of symptoms with weather or environmental changes or other aggravating or alleviating factors except as outlined above   Current Medications, Allergies, Complete Past Medical History, Past Surgical History, Family History, and Social History were reviewed in [employer] record.  ROS  The following are not active complaints unless bolded sore throat, dysphagia, dental problems, itching, sneezing,  nasal congestion or excess/ purulent secretions, ear ache,   fever, chills, sweats, unintended wt loss, pleuritic or exertional cp, hemoptysis,  orthopnea pnd or leg swelling, presyncope, palpitations, heartburn, abdominal pain, anorexia, nausea, vomiting, diarrhea  or change in bowel or urinary habits, change in stools or urine, dysuria,hematuria,  rash, arthralgias, visual complaints, headache, numbness weakness or ataxia or problems with walking or coordination,  change in mood/affect or memory.         Objective:   Physical Exam  01/25/14  124 > 02/25/2014  123  > 11/18/2014  129  > 07/05/2015   121      01/11/14 126 lb (57.153 kg)  06/29/11 138 lb (62.596 kg)  10/10/09 141 lb (63.957 kg)     amb wf with  hopeless/ helpless affect / vital signs reviewed   HEENT: nl dentition, turbinates, and orophanx. Nl external ear canals without cough reflex   NECK :  without JVD/Nodes/TM/ nl carotid upstrokes bilaterally   LUNGS: no acc muscle use, distant bs bilaterally s wheeze /cough    CV:  RRR  no s3 or murmur or increase in P2, no edema   ABD:  soft and nontender with nl excursion in the supine position. No bruits or organomegaly, bowel sounds  nl  MS:  warm without deformities, calf tenderness, cyanosis or clubbing  SKIN: warm and dry without lesions            I personally reviewed images and agree with radiology impression as follows:  CXR:  12 /7/16   Bronchitic changes. Probable emphysema.      Assessment & Plan:

## 2015-07-05 NOTE — Telephone Encounter (Signed)
lmtcb X1 for pt  

## 2015-07-06 NOTE — Assessment & Plan Note (Addendum)
-   PFts 01/13/13 with flat insp loop and abn exp loop as well with fev1  1.10 (35%) and ratio 47 - 01/25/2014  Walked RA x 3 laps @ 185 ft each stopped due to  Fatigue / not sob, no tachycardia/no desat  - 01/25/2014 p extensive coaching HFA effectiveness =   75%  - spirometry 01/25/14  FEV1  0.87 (32%) ratio 53 with abn f/v but no flattening  - PFTs 02/25/2014 FEV1 1.07 (37%) ratio 50 p 31% improvement from saba   - 11/18/2014 p extensive coaching HFA effectiveness =    75% > restarted symbicort 160 2bid  - 07/05/2015   Walked RA  2 laps @ 185 ft each stopped due to  Back pain, "breathing in and out" and fatigue but no desats   DDX of  difficult airways management almost all start with A and  include Adherence, Ace Inhibitors, Acid Reflux, Active Sinus Disease, Alpha 1 Antitripsin deficiency, Anxiety masquerading as Airways dz,  ABPA,  Allergy(esp in young), Aspiration (esp in elderly), Adverse effects of meds,  Active smokers, A bunch of PE's (a small clot burden can't cause this syndrome unless there is already severe underlying pulm or vascular dz with poor reserve) plus two Bs  = Bronchiectasis and Beta blocker use..and one C= CHF   Adherence is always the initial "prime suspect" and is a multilayered concern that requires a "trust but verify" approach in every patient - starting with knowing how to use medications, especially inhalers, correctly, keeping up with refills and understanding the fundamental difference between maintenance and prns vs those medications only taken for a very short course and then stopped and not refilled.  - tells me she's lost insurance since husband died/ will need [place] free meds if qualifies and given 4 weeks of symbicort 160 samples in meantime   Active smoking greatest concern,esp with health costs a direct result which she cannot afford > reviewed  ? Anxiety/depression  > usually at the bottom of this list of usual suspects but should be much higher on this pt's based on  H and P  > Follow up per Primary Care planned    ? Acid (or non-acid) GERD > always difficult to exclude as up to 75% of pts in some series report no assoc GI/ Heartburn symptoms> rec max (24h)  acid suppression and diet restrictions/ reviewed and instructions given in writing.   Offered to help further if she would like but she'll have to meet [place] half way:   To keep things simple, I have asked the patient to first separate medicines that are perceived as maintenance, that is to be taken daily "no matter what", from those medicines that are taken on only on an as-needed basis and I have given the patient examples of both, and then return to see our NP to generate a  detailed  medication calendar which should be followed until the next physician sees the patient and updates it.

## 2016-02-14 ENCOUNTER — Telehealth: Payer: Self-pay | Admitting: Physician Assistant

## 2016-02-14 NOTE — Telephone Encounter (Signed)
appt given  

## 2016-02-14 NOTE — Telephone Encounter (Signed)
Appt made per patient request. Patient states that she has been a patient of [facility] for 13 years

## 2016-02-15 ENCOUNTER — Encounter: Payer: Self-pay | Admitting: Physician Assistant

## 2016-02-15 ENCOUNTER — Ambulatory Visit (INDEPENDENT_AMBULATORY_CARE_PROVIDER_SITE_OTHER): Payer: Self-pay | Admitting: Physician Assistant

## 2016-02-15 VITALS — BP 99/67 | HR 59 | Temp 97.1°F | Ht 65.0 in | Wt 120.0 lb

## 2016-02-15 DIAGNOSIS — G8929 Other chronic pain: Secondary | ICD-10-CM

## 2016-02-15 DIAGNOSIS — I10 Essential (primary) hypertension: Secondary | ICD-10-CM

## 2016-02-15 DIAGNOSIS — M549 Dorsalgia, unspecified: Secondary | ICD-10-CM

## 2016-02-15 DIAGNOSIS — J449 Chronic obstructive pulmonary disease, unspecified: Secondary | ICD-10-CM

## 2016-02-15 DIAGNOSIS — L03119 Cellulitis of unspecified part of limb: Secondary | ICD-10-CM

## 2016-02-15 DIAGNOSIS — J309 Allergic rhinitis, unspecified: Secondary | ICD-10-CM

## 2016-02-15 MED ORDER — CEPHALEXIN 500 MG PO CAPS: 500.0000 mg | ORAL_CAPSULE | Freq: Four times a day (QID) | ORAL | 1 refills | Status: DC

## 2016-02-15 MED ORDER — GABAPENTIN 100 MG PO CAPS: 300.0000 mg | ORAL_CAPSULE | Freq: Every day | ORAL | 6 refills | Status: DC

## 2016-02-15 MED ORDER — BENZONATATE 200 MG PO CAPS: 400.0000 mg | ORAL_CAPSULE | Freq: Three times a day (TID) | ORAL | 11 refills | Status: DC | PRN

## 2016-02-15 NOTE — Patient Instructions (Signed)

## 2016-02-15 NOTE — Progress Notes (Signed)
BP 99/67 (BP Location: Left Arm, Patient Position: Sitting, Cuff Size: Normal)   Pulse (!) 59   Temp 97.1 F (36.2 C) (Oral)   Ht 5\' 5"  (1.651 m)   Wt 120 lb (54.4 kg)   BMI 19.97 kg/m    Subjective:    Patient ID: Melanie Santiago, female    DOB: December 25, 1960, 55 y.o.   MRN: [id number]  CLINT BENTON is a 55 y.o. female presenting on 02/15/2016 for Follow-up   HPI Patient here to be established as new patient at [facility].  This patient is known to me from [facility].She is a long-standing history of degenerative disc disease. At this time she is without insurance. She was awarded disability based on her COPD and degenerative disc disease. She has not had a current MRI due to financial restraints. We have discussed the need for chronic pain referral if she is currently maintained on any high doses of medication. We have discussed that her oxycodone 10 only be out of total of 80 mg per day to maintain that it is under at the 120 mg equivalent states morphine. 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 All of her other medications are reviewed today. She does need refills on Tessalon Perles. She had seen Joellen Jersey a family Designer, jewellery at [facility] in [place].  At this time she is handling the anxiety medication and Soma. As long as he is okay with this will continue at this time. I would like to make a referral to chronic pain as soon as possible. She should be eligible for her insurance in the spring. She understands that she may have to have a contract here for her chronic pain medications. He is doing well with her hypertension, COPD, allergic rhinitis,. All of her medications are reviewed. Unless she is having some lesions on the lower legs that started out as mosquito bites that have had hard time healing and have stayed infected. She would like medication for this.  Relevant past medical, surgical, family and social history reviewed and  updated as indicated. Interim medical history since our last visit reviewed. Allergies and medications reviewed and updated.   Data reviewed from any sources in EPIC.  Review of Systems  Constitutional: Negative.  Negative for activity change, fatigue and fever.  HENT: Negative.   Eyes: Negative.   Respiratory: Negative.  Negative for cough.   Cardiovascular: Negative.  Negative for chest pain.  Gastrointestinal: Negative.  Negative for abdominal pain.  Endocrine: Negative.   Genitourinary: Negative.  Negative for dysuria.  Musculoskeletal: Positive for arthralgias, back pain and myalgias.  Skin: Positive for color change and wound.  Neurological: Negative.  Negative for seizures, weakness and numbness.    Per HPI unless specifically indicated above  Social History   Social History  . Marital status: Married    Spouse name: N/A  . Number of children: N/A  . Years of education: N/A   Occupational History  . housewife    Social History Main Topics  . Smoking status: Current Every Day Smoker    Packs/day: 0.50    Years: 33.00    Types: Cigarettes, E-cigarettes  . Smokeless tobacco: Never Used  . Alcohol use No  . Drug use: No     Comment: abusing past 2 years, last use 06/28/11 at 0800 (1 pill)  . Sexual activity: No   Other Topics Concern  . Not on file   Social History Narrative  .  No narrative on file    Past Surgical History:  Procedure Laterality Date  . ABDOMINAL HYSTERECTOMY    . CHOLECYSTECTOMY    . OVARIAN CYST REMOVAL      Family History  Problem Relation Age of Onset  . Cancer Mother       Medication List       Accurate as of 02/15/16  3:40 PM. Always use your most recent med list.          albuterol 108 (90 Base) MCG/ACT inhaler Commonly known as:  PROVENTIL HFA;VENTOLIN HFA Inhale 2 puffs into the lungs every 6 (six) hours as needed. For shortness of breath.   ALPRAZolam 1 MG tablet Commonly known as:  XANAX Take 1 mg by mouth 2  (two) times daily as needed for anxiety.   benzonatate 200 MG capsule Commonly known as:  TESSALON Take 2 capsules (400 mg total) by mouth 3 (three) times daily as needed for cough.   budesonide-formoterol 160-4.5 MCG/ACT inhaler Commonly known as:  SYMBICORT Take 2 puffs first thing in am and then another 2 puffs about 12 hours later.   carisoprodol 350 MG tablet Commonly known as:  SOMA Take 350 mg by mouth 4 (four) times daily as needed for muscle spasms.   cephALEXin 500 MG capsule Commonly known as:  KEFLEX Take 1 capsule (500 mg total) by mouth 4 (four) times daily.   cholecalciferol 400 units Tabs tablet Commonly known as:  VITAMIN D Take 400 Units by mouth 2 (two) times daily.   estradiol 2 MG tablet Commonly known as:  ESTRACE Take 2 mg by mouth daily.   gabapentin 100 MG capsule Commonly known as:  NEURONTIN Take 3 capsules (300 mg total) by mouth at bedtime.   ipratropium-albuterol 0.5-2.5 (3) MG/3ML Soln Commonly known as:  DUONEB Take 3 mLs by nebulization every 6 (six) hours as needed (for shortness of breath).   loratadine 10 MG tablet Commonly known as:  CLARITIN Take by mouth.   metoprolol succinate 25 MG 24 hr tablet Commonly known as:  TOPROL-XL Take 25 mg by mouth daily.   omeprazole 20 MG capsule Commonly known as:  PRILOSEC Take 1 capsule (20 mg total) by mouth 2 (two) times daily before a meal.   oxycodone 30 MG immediate release tablet Commonly known as:  ROXICODONE Take 30 mg by mouth every 6 (six) hours as needed.   polyethylene glycol packet Commonly known as:  MIRALAX / GLYCOLAX Take by mouth.          Objective:    BP 99/67 (BP Location: Left Arm, Patient Position: Sitting, Cuff Size: Normal)   Pulse (!) 59   Temp 97.1 F (36.2 C) (Oral)   Ht 5\' 5"  (1.651 m)   Wt 120 lb (54.4 kg)   BMI 19.97 kg/m   Allergies  Allergen Reactions  . Augmentin [Amoxicillin-Pot Clavulanate] Nausea And Vomiting  . Sulfa Antibiotics      "makes me sick"   Wt Readings from Last 3 Encounters:  02/15/16 120 lb (54.4 kg)  07/05/15 120 lb 12.8 oz (54.8 kg)  06/01/15 107 lb (48.5 kg)    Physical Exam  Constitutional: She is oriented to person, place, and time. She appears well-developed and well-nourished.  HENT:  Head: Normocephalic and atraumatic.  Eyes: Conjunctivae and EOM are normal. Pupils are equal, round, and reactive to light.  Neck: Normal range of motion. Neck supple.  Cardiovascular: Normal rate, regular rhythm, normal heart sounds and intact distal pulses.  Pulmonary/Chest: Effort normal and breath sounds normal.  Abdominal: Soft. Bowel sounds are normal.  Musculoskeletal:       Lumbar back: She exhibits decreased range of motion, tenderness, deformity, pain and spasm.       Back:  Neurological: She is alert and oriented to person, place, and time. She has normal reflexes.  Skin: Skin is warm and dry. Lesion noted. No rash noted. There is erythema.     Lower legs with multiple excoriated wounds that are itching  Psychiatric: She has a normal mood and affect. Her behavior is normal. Judgment and thought content normal.  Nursing note and vitals reviewed.   Results for orders placed or performed during the hospital encounter of 05/23/14  CBC with Differential  Result Value Ref Range   WBC 7.3 4.0 - 10.5 K/uL   RBC 4.12 3.87 - 5.11 MIL/uL   Hemoglobin 12.5 12.0 - 15.0 g/dL   HCT 36.8 36.0 - 46.0 %   MCV 89.3 78.0 - 100.0 fL   MCH 30.3 26.0 - 34.0 pg   MCHC 34.0 30.0 - 36.0 g/dL   RDW 20.3 (H) 11.5 - 15.5 %   Platelets 205 150 - 400 K/uL   Neutrophils Relative % 72 43 - 77 %   Neutro Abs 5.3 1.7 - 7.7 K/uL   Lymphocytes Relative 17 12 - 46 %   Lymphs Abs 1.3 0.7 - 4.0 K/uL   Monocytes Relative 8 3 - 12 %   Monocytes Absolute 0.6 0.1 - 1.0 K/uL   Eosinophils Relative 2 0 - 5 %   Eosinophils Absolute 0.1 0.0 - 0.7 K/uL   Basophils Relative 0 0 - 1 %   Basophils Absolute 0.0 0.0 - 0.1 K/uL  I-stat  chem 8, ed  Result Value Ref Range   Sodium 140 137 - 147 mEq/L   Potassium 3.8 3.7 - 5.3 mEq/L   Chloride 104 96 - 112 mEq/L   BUN 20 6 - 23 mg/dL   Creatinine, Ser 0.80 0.50 - 1.10 mg/dL   Glucose, Bld 105 (H) 70 - 99 mg/dL   Calcium, Ion 1.15 1.12 - 1.23 mmol/L   TCO2 27 0 - 100 mmol/L   Hemoglobin 13.3 12.0 - 15.0 g/dL   HCT 39.0 36.0 - 46.0 %  I-stat troponin, ED  Result Value Ref Range   Troponin i, poc 0.00 0.00 - 0.08 ng/mL   Comment 3              Assessment & Plan:   1. Essential hypertension Low salt diet, smoking cessation  2. COPD GOLD III with reversibility Smoking cessation - benzonatate (TESSALON) 200 MG capsule; Take 2 capsules (400 mg total) by mouth 3 (three) times daily as needed for cough.  Dispense: 180 capsule; Refill: 11  3. Allergic rhinitis, unspecified allergic rhinitis type Continue loratadine  4. Back pain, chronic Medications reviewed and pain control and contracts reviewed. Has 2 more refills on her oxycodone. Next fill will be oxycodone 20 mg 1 QID prn severe pain. - gabapentin (NEURONTIN) 100 MG capsule; Take 3 capsules (300 mg total) by mouth at bedtime.  Dispense: 90 capsule; Refill: 6  5. Cellulitis of lower extremity, unspecified laterality - cephALEXin (KEFLEX) 500 MG capsule; Take 1 capsule (500 mg total) by mouth 4 (four) times daily.  Dispense: 40 capsule; Refill: 1   Continue all other maintenance medications as listed above.  Follow up plan: Return in about 4 weeks (around 03/14/2016) for recheck on meds.  Terald Sleeper PA-C [facility] [address]  [place], [place] [address] [phone]   02/15/2016, 3:40 PM

## 2016-03-07 ENCOUNTER — Telehealth: Payer: Self-pay | Admitting: Physician Assistant

## 2016-03-07 NOTE — Telephone Encounter (Signed)
Referral made to pain clinic. 

## 2016-03-15 ENCOUNTER — Ambulatory Visit: Payer: Self-pay | Admitting: Physician Assistant

## 2016-03-15 ENCOUNTER — Other Ambulatory Visit: Payer: Self-pay | Admitting: Physician Assistant

## 2016-03-15 DIAGNOSIS — L03119 Cellulitis of unspecified part of limb: Secondary | ICD-10-CM

## 2016-03-20 ENCOUNTER — Ambulatory Visit (INDEPENDENT_AMBULATORY_CARE_PROVIDER_SITE_OTHER): Payer: Self-pay | Admitting: Physician Assistant

## 2016-03-20 ENCOUNTER — Encounter: Payer: Self-pay | Admitting: Physician Assistant

## 2016-03-20 VITALS — BP 118/76 | HR 66 | Temp 97.8°F | Ht 65.0 in | Wt 122.0 lb

## 2016-03-20 DIAGNOSIS — F1120 Opioid dependence, uncomplicated: Secondary | ICD-10-CM

## 2016-03-20 DIAGNOSIS — Z8719 Personal history of other diseases of the digestive system: Secondary | ICD-10-CM

## 2016-03-20 DIAGNOSIS — K2100 Gastro-esophageal reflux disease with esophagitis, without bleeding: Secondary | ICD-10-CM | POA: Insufficient documentation

## 2016-03-20 DIAGNOSIS — M5136 Other intervertebral disc degeneration, lumbar region: Secondary | ICD-10-CM | POA: Insufficient documentation

## 2016-03-20 DIAGNOSIS — M5116 Intervertebral disc disorders with radiculopathy, lumbar region: Secondary | ICD-10-CM

## 2016-03-20 MED ORDER — OXYCODONE HCL 30 MG PO TABS: 30.0000 mg | ORAL_TABLET | Freq: Four times a day (QID) | ORAL | 0 refills | Status: DC | PRN

## 2016-03-20 MED ORDER — OMEPRAZOLE 20 MG PO CPDR: 20.0000 mg | DELAYED_RELEASE_CAPSULE | Freq: Two times a day (BID) | ORAL | 11 refills | Status: AC

## 2016-03-20 MED ORDER — OXYCODONE HCL 30 MG PO TABS: 30.0000 mg | ORAL_TABLET | ORAL | 0 refills | Status: DC | PRN

## 2016-03-20 NOTE — Patient Instructions (Signed)

## 2016-03-20 NOTE — Progress Notes (Signed)
BP 118/76   Pulse 66   Temp 97.8 F (36.6 C) (Oral)   Ht 5\' 5"  (1.651 m)   Wt 122 lb (55.3 kg)   BMI 20.30 kg/m    Subjective:    Patient ID: Melanie Santiago, female    DOB: 10/05/1960, 55 y.o.   MRN: [id number]  HPI: Melanie Santiago is a 55 y.o. female presenting on 03/20/2016 for Follow-up (4 week rck on Medication ) This serves as an appointment for initial pain management. At this time she is seeing Joellen Jersey is a Designer, jewellery at [facility] in [place] for her depression and anxiety.  03/20/2016, 3:06 PM  [place] Controlled Substance Abuse Database reviewed- Yes If yes- were their any concerning findings : NO Depression screen [facility] 2/9 03/20/2016 02/15/2016  Decreased Interest 1 1  Down, Depressed, Hopeless 0 0  PHQ - 2 Score 1 1    No flowsheet data found.   [place] drug screen performed- No  SOAPP 0= never  1= seldom  2=sometimes  3= often  4= very often  1. How often do you have mood swings? 0 2. How often do you smoke a cigarette within an hour after waling up? 4 3. How often have you taken medication other than the way that it was prescribed?0 4. How often have you used illegal drugs in the past 5 years? 0 5. How often, in your lifetime, have you had legal problems or been arrested? 0  Score 4  Alcohol Audit - How often during the last year have found that you: 0-Never   1- Less than monthly   2- Monthly     3-Weekly     4-daily or almost daily  1. found that you were not able to stop drinking once you started- 0 2. failed to do what was normally expected of you because of drinking- 0 3. needed a first drink in the morning- 0 4. had a feeling of guilt or remorse after drinking- 0 5. are/were unable to remember what happened the night before because of your drinking- 0  0- NO   2- yes but not in last year  4- yes during last year 6. Have you or someone else been injured because of your drinking- 0 7. Has anyone been concerned about your  drinking or suggested you cut down- 0        TOTAL- 0   ( 0-7- alcohol education, 8-15- simple advice, 16-19 simple advice plus counseling, 20-40 referral for evaluation and treatment 0   Designated [facility]  Pain assessment: Cause of pain- DDD lumbar Pain location- lumbar spine/SI joints and radiculopathy Pain on scale of 1-10- 8-9 Frequency- daily What increases pain-standing, llifting What makes pain Better-rest, stretch Effects on ADL - moderate as far as housework, laundry, standing for chores  Prior treatments tried and failed- Pt, exercise, NSAIDs, tramadol, chiropractic treatment. Trying gabapentin titration and future consideration of cymbalta. Current treatments- medication and rest, awarded total disabilty Morphine mg equivalent- 240 mg, lowering this month to 210 mg.  Pain management agreement reviewed and signed- Yes    Relevant past medical, surgical, family and social history reviewed and updated as indicated. Interim medical history since our last visit reviewed. Allergies and medications reviewed and updated. DATA REVIEWED: CHART IN EPIC  Social History   Social History  . Marital status: Married    Spouse name: N/A  . Number of children: N/A  . Years of  education: N/A   Occupational History  . housewife    Social History Main Topics  . Smoking status: Current Every Day Smoker    Packs/day: 0.50    Years: 33.00    Types: Cigarettes, E-cigarettes  . Smokeless tobacco: Never Used  . Alcohol use No  . Drug use: No     Comment: abusing past 2 years, last use 06/28/11 at 0800 (1 pill)  . Sexual activity: No   Other Topics Concern  . Not on file   Social History Narrative  . No narrative on file    Past Surgical History:  Procedure Laterality Date  . ABDOMINAL HYSTERECTOMY    . CHOLECYSTECTOMY    . OVARIAN CYST REMOVAL      Family History  Problem Relation Age of Onset  . Cancer Mother     Review of Systems   Constitutional: Negative.  Negative for activity change, fatigue and fever.  HENT: Negative.   Eyes: Negative.   Respiratory: Positive for shortness of breath and wheezing. Negative for cough.   Cardiovascular: Negative.  Negative for chest pain.  Gastrointestinal: Negative.  Negative for abdominal pain.  Endocrine: Negative.   Genitourinary: Negative.  Negative for dysuria.  Musculoskeletal: Positive for back pain, gait problem and myalgias.  Skin: Negative.       Medication List       Accurate as of 03/20/16  3:06 PM. Always use your most recent med list.          albuterol 108 (90 Base) MCG/ACT inhaler Commonly known as:  PROVENTIL HFA;VENTOLIN HFA Inhale 2 puffs into the lungs every 6 (six) hours as needed. For shortness of breath.   ALPRAZolam 1 MG tablet Commonly known as:  XANAX Take 1 mg by mouth 2 (two) times daily as needed for anxiety.   budesonide-formoterol 160-4.5 MCG/ACT inhaler Commonly known as:  SYMBICORT Take 2 puffs first thing in am and then another 2 puffs about 12 hours later.   carisoprodol 350 MG tablet Commonly known as:  SOMA Take 350 mg by mouth 4 (four) times daily as needed for muscle spasms.   cholecalciferol 400 units Tabs tablet Commonly known as:  VITAMIN D Take 400 Units by mouth 2 (two) times daily.   estradiol 2 MG tablet Commonly known as:  ESTRACE Take 2 mg by mouth daily.   gabapentin 100 MG capsule Commonly known as:  NEURONTIN Take 3 capsules (300 mg total) by mouth at bedtime.   ipratropium-albuterol 0.5-2.5 (3) MG/3ML Soln Commonly known as:  DUONEB Take 3 mLs by nebulization every 6 (six) hours as needed (for shortness of breath).   loratadine 10 MG tablet Commonly known as:  CLARITIN Take by mouth.   metoprolol succinate 25 MG 24 hr tablet Commonly known as:  TOPROL-XL Take 25 mg by mouth daily.   omeprazole 20 MG capsule Commonly known as:  PRILOSEC Take 1 capsule (20 mg total) by mouth 2 (two) times daily  before a meal.   oxycodone 30 MG immediate release tablet Commonly known as:  ROXICODONE Take 1-2 tablets (30-60 mg total) by mouth every 4 (four) hours as needed for pain.   oxycodone 30 MG immediate release tablet Commonly known as:  ROXICODONE Take 1-2 tablets (30-60 mg total) by mouth every 4 (four) hours as needed for pain.   oxycodone 30 MG immediate release tablet Commonly known as:  ROXICODONE Take 1-2 tablets (30-60 mg total) by mouth every 6 (six) hours as needed.   polyethylene glycol  packet Commonly known as:  MIRALAX / GLYCOLAX Take by mouth.          Objective:    BP 118/76   Pulse 66   Temp 97.8 F (36.6 C) (Oral)   Ht 5\' 5"  (1.651 m)   Wt 122 lb (55.3 kg)   BMI 20.30 kg/m   Allergies  Allergen Reactions  . Augmentin [Amoxicillin-Pot Clavulanate] Nausea And Vomiting  . Sulfa Antibiotics     "makes me sick"    Wt Readings from Last 3 Encounters:  03/20/16 122 lb (55.3 kg)  02/15/16 120 lb (54.4 kg)  07/05/15 120 lb 12.8 oz (54.8 kg)    Physical Exam  Constitutional: She is oriented to person, place, and time. She appears well-developed and well-nourished.  HENT:  Head: Normocephalic and atraumatic.  Eyes: Conjunctivae and EOM are normal. Pupils are equal, round, and reactive to light.  Cardiovascular: Normal rate, regular rhythm, normal heart sounds and intact distal pulses.   Pulmonary/Chest: Effort normal and breath sounds normal.  Abdominal: Soft. Bowel sounds are normal.  Musculoskeletal:       Lumbar back: She exhibits decreased range of motion, tenderness, pain and spasm.  Neurological: She is alert and oriented to person, place, and time. She has normal reflexes.  Skin: Skin is warm and dry. No rash noted.  Psychiatric: She has a normal mood and affect. Her behavior is normal. Judgment and thought content normal.        Assessment & Plan:   1. DDD (degenerative disc disease), lumbar Continue medications as listed above, consider  orthopedic and neurology when she has insurance in the spring.  2. Radiculopathy due to lumbar intervertebral disc disorder See above  3. Gastroesophageal reflux disease with esophagitis Take omeprazole 20 mg 1 twice daily, elevate head of bed, avoid eating within 3 hours of bedtime  4. History of esophageal stricture See above, consider gastroenterology evaluation if not improved  5. Uncomplicated opioid dependence ([place]) Occasions as listed above. Recheck in 3 months   Continue all other maintenance medications as listed above.  Follow up plan: Return in about 3 months (around 06/20/2016).   Educational handout given for [place] PA-C [facility] [address]  [place], [place] [address] [phone]

## 2016-03-21 ENCOUNTER — Ambulatory Visit: Payer: Self-pay | Admitting: Physician Assistant

## 2016-03-27 ENCOUNTER — Telehealth: Payer: Self-pay | Admitting: Physician Assistant

## 2016-03-27 DIAGNOSIS — M5440 Lumbago with sciatica, unspecified side: Principal | ICD-10-CM

## 2016-03-27 DIAGNOSIS — G8929 Other chronic pain: Secondary | ICD-10-CM

## 2016-03-27 MED ORDER — GABAPENTIN 400 MG PO CAPS: 400.0000 mg | ORAL_CAPSULE | Freq: Every day | ORAL | 2 refills | Status: DC

## 2016-03-27 NOTE — Telephone Encounter (Signed)
Left detailed message that med increased and sent to pharmacy

## 2016-03-27 NOTE — Telephone Encounter (Signed)
Sent!

## 2016-04-12 ENCOUNTER — Other Ambulatory Visit: Payer: Self-pay | Admitting: *Deleted

## 2016-04-30 ENCOUNTER — Telehealth: Payer: Self-pay | Admitting: Physician Assistant

## 2016-04-30 NOTE — Telephone Encounter (Signed)
On 03/20/16 definitive visit for pain control and contract occurred. By her taking too much and without the instruction of the provider, she is in violation of the contract.  Is this appropriate for dismissal?

## 2016-04-30 NOTE — Telephone Encounter (Signed)
When she leaves the information I can try to help. Needs to give information as directed by the processes here.

## 2016-04-30 NOTE — Telephone Encounter (Signed)
Returned patients phone call.  Patient states that she has been taking 7 oxycodone a day and has now ran out of medication and would like a refill. Informed patient that Rx was to last her for 30 days and our office can not refill early due to office policy

## 2016-04-30 NOTE — Telephone Encounter (Signed)
Patient called stating she would like to speak with Particia Nearing.   Attempted to get more details and patient states that she only wants to speak to Particia Nearing.  Message forwarded

## 2016-05-01 ENCOUNTER — Encounter: Payer: Self-pay | Admitting: Physician Assistant

## 2016-05-01 NOTE — Telephone Encounter (Signed)
She will be discharged due to not following her pain contract and not taking the medication in the way it was prescribed.

## 2016-05-02 NOTE — Telephone Encounter (Signed)
Pt is aware of dismissal  She states she doesn't want to get "sick" coming off of these meds, can we give her any pain meds to help her until she finds a new practice.  Pt aware Glenard Haring not back until Friday

## 2016-05-02 NOTE — Telephone Encounter (Signed)
On 04/30/16 she stated that she was out of medication. If she has been fine since then there is not a great worry of her "being sick".

## 2016-05-03 NOTE — Telephone Encounter (Signed)
Cannot do any other meds.  May seek Emergency room treatment if she should become sick at this point.

## 2016-05-03 NOTE — Telephone Encounter (Signed)
Patient aware and voices understanding

## 2016-05-20 ENCOUNTER — Telehealth: Payer: Self-pay | Admitting: Physician Assistant

## 2016-05-20 NOTE — Telephone Encounter (Signed)
Please advise 

## 2016-05-20 NOTE — Telephone Encounter (Signed)
I am not calling. She was discharged. She was told verbally that this was occurring because of her violation of the narcotic agreement, NOT TAKING MEDICATION AS PRESCRIBED. There is nothing else for me to explain.

## 2016-05-30 ENCOUNTER — Other Ambulatory Visit: Payer: Self-pay | Admitting: Physician Assistant

## 2016-05-30 DIAGNOSIS — M5440 Lumbago with sciatica, unspecified side: Principal | ICD-10-CM

## 2016-05-30 DIAGNOSIS — G8929 Other chronic pain: Secondary | ICD-10-CM

## 2016-06-21 ENCOUNTER — Ambulatory Visit: Payer: Self-pay | Admitting: Physician Assistant

## 2016-08-08 ENCOUNTER — Emergency Department (HOSPITAL_COMMUNITY)
Admission: EM | Admit: 2016-08-08 | Discharge: 2016-08-09 | Disposition: A | Discharge status: Home / Self Care | Payer: Medicare HMO | Attending: Emergency Medicine | Admitting: Emergency Medicine

## 2016-08-08 ENCOUNTER — Emergency Department (HOSPITAL_COMMUNITY): Payer: Medicare HMO

## 2016-08-08 ENCOUNTER — Encounter (HOSPITAL_COMMUNITY): Payer: Self-pay

## 2016-08-08 DIAGNOSIS — Z79899 Other long term (current) drug therapy: Secondary | ICD-10-CM | POA: Diagnosis not present

## 2016-08-08 DIAGNOSIS — M545 Low back pain: Secondary | ICD-10-CM | POA: Diagnosis not present

## 2016-08-08 DIAGNOSIS — F17210 Nicotine dependence, cigarettes, uncomplicated: Secondary | ICD-10-CM | POA: Insufficient documentation

## 2016-08-08 DIAGNOSIS — Y939 Activity, unspecified: Secondary | ICD-10-CM | POA: Diagnosis not present

## 2016-08-08 DIAGNOSIS — R0789 Other chest pain: Secondary | ICD-10-CM | POA: Insufficient documentation

## 2016-08-08 DIAGNOSIS — Y92481 Parking lot as the place of occurrence of the external cause: Secondary | ICD-10-CM | POA: Diagnosis not present

## 2016-08-08 DIAGNOSIS — J449 Chronic obstructive pulmonary disease, unspecified: Secondary | ICD-10-CM | POA: Insufficient documentation

## 2016-08-08 DIAGNOSIS — Y999 Unspecified external cause status: Secondary | ICD-10-CM | POA: Insufficient documentation

## 2016-08-08 DIAGNOSIS — R079 Chest pain, unspecified: Secondary | ICD-10-CM

## 2016-08-08 DIAGNOSIS — I10 Essential (primary) hypertension: Secondary | ICD-10-CM | POA: Diagnosis not present

## 2016-08-08 MED ORDER — HYDROCODONE-ACETAMINOPHEN 5-325 MG PO TABS
1.0000 | ORAL_TABLET | Freq: Once | ORAL | Status: AC
  Administered 2016-08-08: 1 via ORAL
  Filled 2016-08-08: qty 1

## 2016-08-08 MED ORDER — IBUPROFEN 600 MG PO TABS: 600.0000 mg | ORAL_TABLET | Freq: Three times a day (TID) | ORAL | 0 refills | Status: DC | PRN

## 2016-08-08 MED ORDER — METHOCARBAMOL 500 MG PO TABS: 500.0000 mg | ORAL_TABLET | Freq: Three times a day (TID) | ORAL | 0 refills | Status: DC | PRN

## 2016-08-08 NOTE — ED Notes (Signed)
Pt ambulated to bathroom 

## 2016-08-08 NOTE — ED Notes (Signed)
Patient transported to X-ray 
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
## 2016-08-08 NOTE — ED Notes (Signed)
Pt stable, ambulatory, states understanding of discharge instructions 
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
## 2016-08-11 ENCOUNTER — Encounter (HOSPITAL_COMMUNITY): Payer: Self-pay | Admitting: Emergency Medicine

## 2016-08-11 ENCOUNTER — Emergency Department (HOSPITAL_COMMUNITY)
Admission: EM | Admit: 2016-08-11 | Discharge: 2016-08-12 | Disposition: A | Discharge status: Home / Self Care | Payer: Medicare HMO | Attending: Emergency Medicine | Admitting: Emergency Medicine

## 2016-08-11 ENCOUNTER — Emergency Department (HOSPITAL_COMMUNITY): Payer: Medicare HMO

## 2016-08-11 DIAGNOSIS — Y939 Activity, unspecified: Secondary | ICD-10-CM | POA: Diagnosis not present

## 2016-08-11 DIAGNOSIS — F17210 Nicotine dependence, cigarettes, uncomplicated: Secondary | ICD-10-CM | POA: Diagnosis not present

## 2016-08-11 DIAGNOSIS — G8929 Other chronic pain: Secondary | ICD-10-CM

## 2016-08-11 DIAGNOSIS — M545 Low back pain, unspecified: Secondary | ICD-10-CM

## 2016-08-11 DIAGNOSIS — Y92410 Unspecified street and highway as the place of occurrence of the external cause: Secondary | ICD-10-CM | POA: Insufficient documentation

## 2016-08-11 DIAGNOSIS — S3992XA Unspecified injury of lower back, initial encounter: Secondary | ICD-10-CM | POA: Diagnosis present

## 2016-08-11 DIAGNOSIS — I10 Essential (primary) hypertension: Secondary | ICD-10-CM | POA: Insufficient documentation

## 2016-08-11 DIAGNOSIS — Y999 Unspecified external cause status: Secondary | ICD-10-CM | POA: Diagnosis not present

## 2016-08-11 DIAGNOSIS — J449 Chronic obstructive pulmonary disease, unspecified: Secondary | ICD-10-CM | POA: Insufficient documentation

## 2016-08-11 LAB — I-STAT CHEM 8, ED
BUN: 10 mg/dL (ref 6–20)
CALCIUM ION: 1.08 mmol/L — AB (ref 1.15–1.40)
CREATININE: 1.5 mg/dL — AB (ref 0.44–1.00)
Chloride: 110 mmol/L (ref 101–111)
GLUCOSE: 114 mg/dL — AB (ref 65–99)
HCT: 36 % (ref 36.0–46.0)
Hemoglobin: 12.2 g/dL (ref 12.0–15.0)
Potassium: 3.3 mmol/L — ABNORMAL LOW (ref 3.5–5.1)
Sodium: 140 mmol/L (ref 135–145)
TCO2: 20 mmol/L (ref 0–100)

## 2016-08-11 MED ORDER — MORPHINE SULFATE (PF) 4 MG/ML IV SOLN
8.0000 mg | Freq: Once | INTRAVENOUS | Status: AC
  Administered 2016-08-11: 8 mg via INTRAMUSCULAR
  Filled 2016-08-11: qty 2
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
## 2016-08-12 MED ORDER — DICLOFENAC SODIUM 75 MG PO TBEC: 75.0000 mg | DELAYED_RELEASE_TABLET | Freq: Two times a day (BID) | ORAL | 0 refills | Status: DC

## 2016-08-12 NOTE — Discharge Instructions (Signed)
Apply ice packs on/off to your back.  Follow-up with your primary doctor this week for recheck.  Stop the ibuprofen.

## 2016-08-25 ENCOUNTER — Emergency Department (HOSPITAL_COMMUNITY)
Admission: EM | Admit: 2016-08-25 | Discharge: 2016-08-25 | Disposition: A | Discharge status: Home / Self Care | Payer: Medicare HMO | Attending: Emergency Medicine | Admitting: Emergency Medicine

## 2016-08-25 ENCOUNTER — Encounter (HOSPITAL_COMMUNITY): Payer: Self-pay | Admitting: *Deleted

## 2016-08-25 DIAGNOSIS — M545 Low back pain: Secondary | ICD-10-CM | POA: Diagnosis present

## 2016-08-25 DIAGNOSIS — F17210 Nicotine dependence, cigarettes, uncomplicated: Secondary | ICD-10-CM | POA: Diagnosis not present

## 2016-08-25 DIAGNOSIS — I10 Essential (primary) hypertension: Secondary | ICD-10-CM | POA: Insufficient documentation

## 2016-08-25 DIAGNOSIS — G8929 Other chronic pain: Secondary | ICD-10-CM

## 2016-08-25 DIAGNOSIS — J449 Chronic obstructive pulmonary disease, unspecified: Secondary | ICD-10-CM | POA: Insufficient documentation

## 2016-08-25 MED ORDER — NAPROXEN 250 MG PO TABS: 250.0000 mg | ORAL_TABLET | Freq: Two times a day (BID) | ORAL | 0 refills | Status: DC | PRN

## 2016-08-25 MED ORDER — IBUPROFEN 400 MG PO TABS
400.0000 mg | ORAL_TABLET | Freq: Once | ORAL | Status: AC
  Administered 2016-08-25: 400 mg via ORAL
  Filled 2016-08-25: qty 1

## 2016-08-25 NOTE — ED Provider Notes (Signed)
[place] DEPT Provider Note   CSN: [id number] Arrival date & time: 08/25/16  1933     History   Chief Complaint Chief Complaint  Patient presents with  . Back Pain    HPI Melanie Santiago is a 56 y.o. female.  HPI  Pt was seen at 2025. Per pt, c/o gradual onset and persistence of constant acute flair of her chronic low back "pain" for the past several days.  Denies any change in her usual chronic pain pattern.  Pain worsens with palpation of the area and body position changes. Denies incont/retention of bowel or bladder, no saddle anesthesia, no focal motor weakness, no tingling/numbness in extremities, no fevers, no new injury, no abd pain.   The symptoms have been associated with no other complaints. The patient has a significant history of similar symptoms previously, recently being evaluated for this complaint and multiple prior evals for same.    Past Medical History:  Diagnosis Date  . Back pain, chronic   . COPD (chronic obstructive pulmonary disease) ([place])   . Hypertension     Patient Active Problem List   Diagnosis Date Noted  . DDD (degenerative disc disease), lumbar 03/20/2016  . Radiculopathy due to lumbar intervertebral disc disorder 03/20/2016  . Gastroesophageal reflux disease with esophagitis 03/20/2016  . History of esophageal stricture 03/20/2016  . Cigarette smoker 01/13/2014  . Opiate dependence ([place]) 06/29/2011  . Back pain, chronic 06/29/2011  . Essential hypertension 08/17/2009  . Allergic rhinitis 08/17/2009  . COPD GOLD III with reversibility 08/17/2009  . COUGH 08/17/2009    Past Surgical History:  Procedure Laterality Date  . ABDOMINAL HYSTERECTOMY    . CHOLECYSTECTOMY    . OVARIAN CYST REMOVAL      OB History    No data available       Home Medications    Prior to Admission medications   Medication Sig Start Date End Date Taking? Authorizing Provider  albuterol (PROVENTIL HFA;VENTOLIN HFA) 108 (90 Base) MCG/ACT inhaler  Inhale 2 puffs into the lungs every 6 (six) hours as needed. For shortness of breath. 07/05/15   Tanda Rockers, MD  ALPRAZolam Duanne Moron) 1 MG tablet Take 1 mg by mouth 2 (two) times daily as needed for anxiety.    Historical Provider, MD  budesonide-formoterol (SYMBICORT) 160-4.5 MCG/ACT inhaler Take 2 puffs first thing in am and then another 2 puffs about 12 hours later. 11/18/14   Tanda Rockers, MD  carisoprodol (SOMA) 350 MG tablet Take 350 mg by mouth 4 (four) times daily as needed for muscle spasms.    Historical Provider, MD  cholecalciferol (VITAMIN D) 400 units TABS tablet Take 400 Units by mouth 2 (two) times daily.    Historical Provider, MD  diclofenac (VOLTAREN) 75 MG EC tablet Take 1 tablet (75 mg total) by mouth 2 (two) times daily. Take with food 08/12/16   Tammy Triplett, PA-C  DULoxetine (CYMBALTA) 30 MG capsule Take 30 mg by mouth 2 (two) times daily. 08/01/16   Historical Provider, MD  estradiol (ESTRACE) 2 MG tablet Take 2 mg by mouth daily.    Historical Provider, MD  gabapentin (NEURONTIN) 400 MG capsule TAKE 1 TO 2 CAPSULES AT BEDTIME Patient taking differently: TAKE 2 CAPSULES AT BEDTIME 05/30/16   Terald Sleeper, PA-C  ipratropium-albuterol (DUONEB) 0.5-2.5 (3) MG/3ML SOLN Take 3 mLs by nebulization every 6 (six) hours as needed (for shortness of breath).    Historical Provider, MD  loratadine (CLARITIN) 10 MG tablet Take  10 mg by mouth daily.     Historical Provider, MD  methocarbamol (ROBAXIN) 500 MG tablet Take 1 tablet (500 mg total) by mouth every 8 (eight) hours as needed for muscle spasms. 08/08/16   Jola Schmidt, MD  metoprolol succinate (TOPROL-XL) 25 MG 24 hr tablet Take 25 mg by mouth daily.    Historical Provider, MD  omeprazole (PRILOSEC) 20 MG capsule Take 1 capsule (20 mg total) by mouth 2 (two) times daily before a meal. Patient taking differently: Take 20 mg by mouth daily.  03/20/16   Terald Sleeper, PA-C    Family History Family History  Problem Relation Age of  Onset  . Cancer Mother     Social History Social History  Substance Use Topics  . Smoking status: Current Every Day Smoker    Packs/day: 0.50    Years: 33.00    Types: Cigarettes, E-cigarettes  . Smokeless tobacco: Never Used  . Alcohol use No     Allergies   Augmentin [amoxicillin-pot clavulanate] and Sulfa antibiotics   Review of Systems Review of Systems ROS: Statement: All systems negative except as marked or noted in the HPI; Constitutional: Negative for fever and chills. ; ; Eyes: Negative for eye pain, redness and discharge. ; ; ENMT: Negative for ear pain, hoarseness, nasal congestion, sinus pressure and sore throat. ; ; Cardiovascular: Negative for chest pain, palpitations, diaphoresis, dyspnea and peripheral edema. ; ; Respiratory: Negative for cough, wheezing and stridor. ; ; Gastrointestinal: Negative for nausea, vomiting, diarrhea, abdominal pain, blood in stool, hematemesis, jaundice and rectal bleeding. . ; ; Genitourinary: Negative for dysuria, flank pain and hematuria. ; ; Musculoskeletal: +LBP. Negative for neck pain. Negative for swelling and trauma.; ; Skin: Negative for pruritus, rash, abrasions, blisters, bruising and skin lesion.; ; Neuro: Negative for headache, lightheadedness and neck stiffness. Negative for weakness, altered level of consciousness, altered mental status, extremity weakness, paresthesias, involuntary movement, seizure and syncope.       Physical Exam Updated Vital Signs BP 120/93   Pulse 75   Temp 97.7 F (36.5 C) (Oral)   Resp 16   Ht 5\' 5"  (1.651 m)   Wt 135 lb (61.2 kg)   SpO2 95%   BMI 22.47 kg/m   Physical Exam 2030: Physical examination:  Nursing notes reviewed; Vital signs and O2 SAT reviewed;  Constitutional: Well developed, Well nourished, Well hydrated, In no acute distress; Head:  Normocephalic, atraumatic; Eyes: EOMI, PERRL, No scleral icterus; ENMT: Mouth and pharynx normal, Mucous membranes moist; Neck: Supple, Full  range of motion, No lymphadenopathy; Cardiovascular: Regular rate and rhythm, No gallop; Respiratory: Breath sounds clear & equal bilaterally, No wheezes.  Speaking full sentences with ease, Normal respiratory effort/excursion; Chest: Nontender, Movement normal; Abdomen: Soft, Nontender, Nondistended, Normal bowel sounds; Genitourinary: No CVA tenderness; Spine:  No midline CS, TS, LS tenderness. +TTP right lumbar paraspinal muscles.;; Extremities: Pulses normal, No tenderness, No edema, No calf edema or asymmetry.; Neuro: AA&Ox3, Major CN grossly intact.  Speech clear. No gross focal motor or sensory deficits in extremities. Strength 5/5 equal bilat UE's and LE's, including great toe dorsiflexion.  DTR 2/4 equal bilat UE's and LE's.  No gross sensory deficits.  Neg straight leg raises bilat.; Skin: Color normal, Warm, Dry.   ED Treatments / Results  Labs (all labs ordered are listed, but only abnormal results are displayed)   EKG  EKG Interpretation None       Radiology  Procedures Procedures (including critical care time)  Medications Ordered in ED Medications - No data to display   Initial Impression / Assessment and Plan / ED Course  I have reviewed the triage vital signs and the nursing notes.  Pertinent labs & imaging results that were available during my care of the patient were reviewed by me and considered in my medical decision making (see chart for details).  MDM Reviewed: previous chart, nursing note and vitals Reviewed previous: x-ray    2040:  Pt requesting "some pain meds" and "a shot" multiple times to multiple ED staff since arrival. Pt requested same to me, as well as rx/refill of her soma. Explained to pt that the ED is not the proper venue to refill chronic pain medications, and she will have to return to her Pain Management doctor for these.  Controlled Substance Database accessed: pt filled zubsolv #30 on 08/15/16; xanax #270 on 08/01/16; and soma #120 on  08/01/16 (with one refill).  Offered to tx pain with NSAID and APAP; pt refuses, stating "they don't work." Continues to request narcotics and soma. Will not dose. Pt states she will leave now.  Pt strongly encouraged to f/u with her PMD and Pain Management doctor for good continuity of care and control of her chronic pain.  Pt verb understanding.     Final Clinical Impressions(s) / ED Diagnoses   Final diagnoses:  None    New Prescriptions New Prescriptions   No medications on file     Francine Graven, DO 08/28/16 1546

## 2016-08-25 NOTE — ED Notes (Signed)
Pt requesting pain medication.  

## 2016-08-25 NOTE — Discharge Instructions (Signed)
Take the prescription as directed.  Apply moist heat or ice to the area(s) of discomfort, for 15 minutes at a time, several times per day for the next few days.  Do not fall asleep on a heating or ice pack.  Call your regular medical doctor tomorrow to schedule a follow up appointment in the next 1 to 2 days.  Return to the Emergency Department immediately if worsening.
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
## 2016-08-26 ENCOUNTER — Encounter (HOSPITAL_COMMUNITY): Payer: Self-pay | Admitting: Cardiology

## 2016-08-26 ENCOUNTER — Emergency Department (HOSPITAL_COMMUNITY)
Admission: EM | Admit: 2016-08-26 | Discharge: 2016-08-26 | Disposition: A | Discharge status: Home / Self Care | Payer: Medicare HMO | Attending: Emergency Medicine | Admitting: Emergency Medicine

## 2016-08-26 DIAGNOSIS — F17210 Nicotine dependence, cigarettes, uncomplicated: Secondary | ICD-10-CM | POA: Insufficient documentation

## 2016-08-26 DIAGNOSIS — T424X5A Adverse effect of benzodiazepines, initial encounter: Secondary | ICD-10-CM | POA: Diagnosis not present

## 2016-08-26 DIAGNOSIS — R410 Disorientation, unspecified: Secondary | ICD-10-CM | POA: Diagnosis present

## 2016-08-26 DIAGNOSIS — Z79899 Other long term (current) drug therapy: Secondary | ICD-10-CM | POA: Diagnosis not present

## 2016-08-26 DIAGNOSIS — J449 Chronic obstructive pulmonary disease, unspecified: Secondary | ICD-10-CM | POA: Insufficient documentation

## 2016-08-26 DIAGNOSIS — I10 Essential (primary) hypertension: Secondary | ICD-10-CM | POA: Insufficient documentation

## 2016-08-26 DIAGNOSIS — T50905A Adverse effect of unspecified drugs, medicaments and biological substances, initial encounter: Secondary | ICD-10-CM

## 2016-08-26 LAB — I-STAT CHEM 8, ED
BUN: 23 mg/dL — ABNORMAL HIGH (ref 6–20)
CALCIUM ION: 1.12 mmol/L — AB (ref 1.15–1.40)
Chloride: 102 mmol/L (ref 101–111)
Creatinine, Ser: 1.1 mg/dL — ABNORMAL HIGH (ref 0.44–1.00)
Glucose, Bld: 105 mg/dL — ABNORMAL HIGH (ref 65–99)
HEMATOCRIT: 33 % — AB (ref 36.0–46.0)
HEMOGLOBIN: 11.2 g/dL — AB (ref 12.0–15.0)
Potassium: 3.4 mmol/L — ABNORMAL LOW (ref 3.5–5.1)
SODIUM: 144 mmol/L (ref 135–145)
TCO2: 32 mmol/L (ref 0–100)

## 2016-08-26 LAB — ETHANOL

## 2016-08-26 LAB — CBG MONITORING, ED: Glucose-Capillary: 120 mg/dL — ABNORMAL HIGH (ref 65–99)

## 2016-08-26 MED ORDER — SODIUM CHLORIDE 0.9 % IV SOLN
INTRAVENOUS | Status: DC
  Administered 2016-08-26: 14:00:00 via INTRAVENOUS

## 2016-08-26 MED ORDER — NALOXONE HCL 0.4 MG/ML IJ SOLN
0.4000 mg | Freq: Once | INTRAMUSCULAR | Status: AC
  Administered 2016-08-26: 0.4 mg via INTRAVENOUS
  Filled 2016-08-26: qty 1

## 2016-08-26 MED ORDER — SODIUM CHLORIDE 0.9 % IV BOLUS (SEPSIS)
500.0000 mL | Freq: Once | INTRAVENOUS | Status: AC
  Administered 2016-08-26: 500 mL via INTRAVENOUS
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
## 2016-08-26 NOTE — ED Triage Notes (Addendum)
Patient sitting in wheelchair asleep with head slumped over onto chest. Patient hard to awaken. Took patient to side and rechecked vital signs. Asked patient what medications she took this morning, patient states "I just took my usual medications." Patient unable to tell this nurse what medications she took specifically. Asked patient if she took her xanax. Patient states she took one xanax this morning, then states "and then I took another one right before ya'll came this morning." Patient advises she took a total of 2 - 1 mg xanax prior to arrival to ER. Patient now more awake and asking for a blanket. Patient also now states "I also took an 800 mg ibuprofen and my suboxone cause I can't take pain medicine."

## 2016-08-26 NOTE — ED Notes (Signed)
Pt states understanding of care given and follow up instructions.  A/O, ambulated to waiting area with steady gait.

## 2016-08-26 NOTE — ED Triage Notes (Signed)
Pt drowsy in triage.  Slurred speech.  States she was here yesterday for same.

## 2016-08-26 NOTE — Discharge Instructions (Signed)
Follow-up with your regular doctor. Return for any new or worse symptoms.
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
## 2016-08-29 ENCOUNTER — Emergency Department (HOSPITAL_COMMUNITY)
Admission: EM | Admit: 2016-08-29 | Discharge: 2016-08-29 | Disposition: A | Discharge status: Home / Self Care | Payer: Medicare HMO | Attending: Emergency Medicine | Admitting: Emergency Medicine

## 2016-08-29 ENCOUNTER — Encounter (HOSPITAL_COMMUNITY): Payer: Self-pay | Admitting: *Deleted

## 2016-08-29 DIAGNOSIS — F17210 Nicotine dependence, cigarettes, uncomplicated: Secondary | ICD-10-CM | POA: Diagnosis not present

## 2016-08-29 DIAGNOSIS — Z79899 Other long term (current) drug therapy: Secondary | ICD-10-CM | POA: Insufficient documentation

## 2016-08-29 DIAGNOSIS — F1120 Opioid dependence, uncomplicated: Secondary | ICD-10-CM | POA: Diagnosis not present

## 2016-08-29 DIAGNOSIS — R441 Visual hallucinations: Secondary | ICD-10-CM | POA: Diagnosis not present

## 2016-08-29 DIAGNOSIS — J45909 Unspecified asthma, uncomplicated: Secondary | ICD-10-CM | POA: Diagnosis not present

## 2016-08-29 DIAGNOSIS — I10 Essential (primary) hypertension: Secondary | ICD-10-CM | POA: Diagnosis not present

## 2016-08-29 NOTE — ED Provider Notes (Signed)
[place] DEPT Provider Note   CSN: [id number] Arrival date & time: 08/29/16  2218     History   Chief Complaint Chief Complaint  Patient presents with  . Drug Problem  . Hallucinations    HPI Melanie Santiago is a 56 y.o. female.  56 y/o female w/ h/o opiate dependence, currently on suboxone tx, presents w/ 2 weeks of visual hallucinations Pt states that she has been compliant w/ her suboxone and not abusing other opiates No SI/HI, denies responding to internal stimuli No daily etoh use Denies tremors No vomiting, diarrhea, or abd cramping No headaches She called her pcp and was told to come here for placement into a 30 day tx facility      Past Medical History:  Diagnosis Date  . Back pain, chronic   . COPD (chronic obstructive pulmonary disease) ([place])   . Hypertension     Patient Active Problem List   Diagnosis Date Noted  . DDD (degenerative disc disease), lumbar 03/20/2016  . Radiculopathy due to lumbar intervertebral disc disorder 03/20/2016  . Gastroesophageal reflux disease with esophagitis 03/20/2016  . History of esophageal stricture 03/20/2016  . Cigarette smoker 01/13/2014  . Opiate dependence ([place]) 06/29/2011  . Back pain, chronic 06/29/2011  . Essential hypertension 08/17/2009  . Allergic rhinitis 08/17/2009  . COPD GOLD III with reversibility 08/17/2009  . COUGH 08/17/2009    Past Surgical History:  Procedure Laterality Date  . ABDOMINAL HYSTERECTOMY    . CHOLECYSTECTOMY    . OVARIAN CYST REMOVAL      OB History    No data available       Home Medications    Prior to Admission medications   Medication Sig Start Date End Date Taking? Authorizing Provider  albuterol (PROVENTIL HFA;VENTOLIN HFA) 108 (90 Base) MCG/ACT inhaler Inhale 2 puffs into the lungs every 6 (six) hours as needed. For shortness of breath. 07/05/15  Yes Tanda Rockers, MD  ALPRAZolam Duanne Moron) 1 MG tablet Take 1 mg by mouth 2 (two) times daily as needed for  anxiety.   Yes Historical Provider, MD  budesonide-formoterol (SYMBICORT) 160-4.5 MCG/ACT inhaler Take 2 puffs first thing in am and then another 2 puffs about 12 hours later. Patient taking differently: Inhale 2 puffs into the lungs 2 (two) times daily.  11/18/14  Yes Tanda Rockers, MD  cholecalciferol (VITAMIN D) 1000 units tablet Take 1,000 Units by mouth 2 (two) times daily.   Yes Historical Provider, MD  cyclobenzaprine (FLEXERIL) 10 MG tablet Take 1 tablet by mouth 3 (three) times daily as needed for muscle spasms.    Yes Historical Provider, MD  DULoxetine (CYMBALTA) 30 MG capsule Take 30 mg by mouth 2 (two) times daily.   Yes Historical Provider, MD  estradiol (ESTRACE) 2 MG tablet Take 2 mg by mouth daily.   Yes Historical Provider, MD  gabapentin (NEURONTIN) 400 MG capsule TAKE 1 TO 2 CAPSULES AT BEDTIME Patient taking differently: TAKE 2 CAPSULES BY MOUTH AT BEDTIME 05/30/16  Yes Terald Sleeper, PA-C  ipratropium-albuterol (DUONEB) 0.5-2.5 (3) MG/3ML SOLN Take 3 mLs by nebulization every 6 (six) hours as needed (for shortness of breath).   Yes Historical Provider, MD  metoprolol succinate (TOPROL-XL) 25 MG 24 hr tablet Take 25 mg by mouth daily.   Yes Historical Provider, MD  omeprazole (PRILOSEC) 20 MG capsule Take 1 capsule (20 mg total) by mouth 2 (two) times daily before a meal. 03/20/16  Yes Terald Sleeper, PA-C  ZUBSOLV  5.7-1.4 MG SUBL Place 1 tablet under the tongue 2 (two) times daily.   Yes Historical Provider, MD    Family History Family History  Problem Relation Age of Onset  . Cancer Mother     Social History Social History  Substance Use Topics  . Smoking status: Current Every Day Smoker    Packs/day: 0.50    Years: 33.00    Types: Cigarettes, E-cigarettes  . Smokeless tobacco: Never Used  . Alcohol use No     Allergies   Augmentin [amoxicillin-pot clavulanate] and Sulfa antibiotics   Review of Systems Review of Systems  All other systems reviewed and are  negative.    Physical Exam Updated Vital Signs BP 125/74 (BP Location: Left Arm)   Pulse 99   Temp 98.2 F (36.8 C)   Resp 18   SpO2 95%   Physical Exam  Constitutional: She is oriented to person, place, and time. She appears well-developed and well-nourished.  Non-toxic appearance. No distress.  HENT:  Head: Normocephalic and atraumatic.  Eyes: Conjunctivae, EOM and lids are normal. Pupils are equal, round, and reactive to light.  Neck: Normal range of motion. Neck supple. No tracheal deviation present. No thyroid mass present.  Cardiovascular: Normal rate, regular rhythm and normal heart sounds.  Exam reveals no gallop.   No murmur heard. Pulmonary/Chest: Effort normal and breath sounds normal. No stridor. No respiratory distress. She has no decreased breath sounds. She has no wheezes. She has no rhonchi. She has no rales.  Abdominal: Soft. Normal appearance and bowel sounds are normal. She exhibits no distension. There is no tenderness. There is no rebound and no CVA tenderness.  Musculoskeletal: Normal range of motion. She exhibits no edema or tenderness.  Neurological: She is alert and oriented to person, place, and time. She has normal strength. No cranial nerve deficit or sensory deficit. GCS eye subscore is 4. GCS verbal subscore is 5. GCS motor subscore is 6.  Skin: Skin is warm and dry. No abrasion and no rash noted.  Psychiatric: She has a normal mood and affect. Her speech is normal. She is withdrawn. She is not actively hallucinating. She does not express inappropriate judgment. She expresses no suicidal plans and no homicidal plans. She is attentive.  Nursing note and vitals reviewed.    ED Treatments / Results  Labs (all labs ordered are listed, but only abnormal results are displayed) Labs Reviewed - No data to display  EKG  EKG Interpretation None       Radiology No results found.  Procedures Procedures (including critical care time)  Medications  Ordered in ED Medications - No data to display   Initial Impression / Assessment and Plan / ED Course  I have reviewed the triage vital signs and the nursing notes.  Pertinent labs & imaging results that were available during my care of the patient were reviewed by me and considered in my medical decision making (see chart for details).     Pt with normal vital signs Currently alert and oriented and doesn't appear to be hallucinating No apparent acute medical or psych emergency Will give pt resources for residential tx  Final Clinical Impressions(s) / ED Diagnoses   Final diagnoses:  None    New Prescriptions New Prescriptions   No medications on file     Lacretia Leigh, MD 08/29/16 2324

## 2016-08-29 NOTE — ED Triage Notes (Signed)
Pt would like detox from oxycodone. Pt also states she is having hallucinations. Pt states she is seeing people that aren't there. Patient denies SI/HI. Pt states she has chronic back pain.

## 2016-08-31 ENCOUNTER — Encounter (HOSPITAL_COMMUNITY): Payer: Self-pay | Admitting: Emergency Medicine

## 2016-08-31 ENCOUNTER — Emergency Department (HOSPITAL_COMMUNITY)
Admission: EM | Admit: 2016-08-31 | Discharge: 2016-08-31 | Disposition: A | Discharge status: Home / Self Care | Payer: Medicare HMO | Attending: Emergency Medicine | Admitting: Emergency Medicine

## 2016-08-31 DIAGNOSIS — F1110 Opioid abuse, uncomplicated: Secondary | ICD-10-CM | POA: Insufficient documentation

## 2016-08-31 DIAGNOSIS — Z79899 Other long term (current) drug therapy: Secondary | ICD-10-CM | POA: Diagnosis not present

## 2016-08-31 DIAGNOSIS — J449 Chronic obstructive pulmonary disease, unspecified: Secondary | ICD-10-CM | POA: Diagnosis not present

## 2016-08-31 DIAGNOSIS — F1910 Other psychoactive substance abuse, uncomplicated: Secondary | ICD-10-CM | POA: Diagnosis present

## 2016-08-31 DIAGNOSIS — I10 Essential (primary) hypertension: Secondary | ICD-10-CM | POA: Diagnosis not present

## 2016-08-31 DIAGNOSIS — R443 Hallucinations, unspecified: Secondary | ICD-10-CM | POA: Diagnosis not present

## 2016-08-31 DIAGNOSIS — F17210 Nicotine dependence, cigarettes, uncomplicated: Secondary | ICD-10-CM | POA: Diagnosis not present

## 2016-08-31 HISTORY — DX: Opioid abuse, uncomplicated: F11.10

## 2016-08-31 LAB — CBC WITH DIFFERENTIAL/PLATELET
Basophils Absolute: 0 10*3/uL (ref 0.0–0.1)
Basophils Relative: 1 %
EOS PCT: 2 %
Eosinophils Absolute: 0.1 10*3/uL (ref 0.0–0.7)
HCT: 35.1 % — ABNORMAL LOW (ref 36.0–46.0)
Hemoglobin: 11.8 g/dL — ABNORMAL LOW (ref 12.0–15.0)
Lymphocytes Relative: 38 %
Lymphs Abs: 2.2 10*3/uL (ref 0.7–4.0)
MCH: 29.4 pg (ref 26.0–34.0)
MCHC: 33.6 g/dL (ref 30.0–36.0)
MCV: 87.5 fL (ref 78.0–100.0)
MONO ABS: 0.3 10*3/uL (ref 0.1–1.0)
MONOS PCT: 6 %
Neutro Abs: 3.1 10*3/uL (ref 1.7–7.7)
Neutrophils Relative %: 54 %
Platelets: ADEQUATE 10*3/uL (ref 150–400)
RBC: 4.01 MIL/uL (ref 3.87–5.11)
RDW: 22.1 % — AB (ref 11.5–15.5)
WBC: 5.8 10*3/uL (ref 4.0–10.5)

## 2016-08-31 LAB — RAPID URINE DRUG SCREEN, HOSP PERFORMED
Amphetamines: NOT DETECTED
BARBITURATES: NOT DETECTED
Benzodiazepines: POSITIVE — AB
COCAINE: NOT DETECTED
OPIATES: NOT DETECTED
TETRAHYDROCANNABINOL: NOT DETECTED

## 2016-08-31 LAB — COMPREHENSIVE METABOLIC PANEL
ALBUMIN: 3.6 g/dL (ref 3.5–5.0)
ALK PHOS: 62 U/L (ref 38–126)
ALT: 21 U/L (ref 14–54)
AST: 25 U/L (ref 15–41)
Anion gap: 8 (ref 5–15)
BILIRUBIN TOTAL: 0.4 mg/dL (ref 0.3–1.2)
BUN: 16 mg/dL (ref 6–20)
CALCIUM: 9.3 mg/dL (ref 8.9–10.3)
CO2: 27 mmol/L (ref 22–32)
CREATININE: 0.98 mg/dL (ref 0.44–1.00)
Chloride: 102 mmol/L (ref 101–111)
GFR calc Af Amer: >60 mL/min (ref >60)
GFR calc non Af Amer: >60 mL/min (ref >60)
GLUCOSE: 111 mg/dL — AB (ref 65–99)
Potassium: 3.6 mmol/L (ref 3.5–5.1)
Sodium: 137 mmol/L (ref 135–145)
Total Protein: 6.5 g/dL (ref 6.5–8.1)

## 2016-08-31 LAB — ACETAMINOPHEN LEVEL: Acetaminophen (Tylenol), Serum: <10 ug/mL — ABNORMAL LOW (ref 10–30)

## 2016-08-31 NOTE — ED Notes (Signed)
Pt reports using for the last 12 years- Was seen here last Sunday night as well as last Monday with RNs reports that she appeared to be impaired- pt reports not using in the last 3 week- She reports that she was hallucinating but is not currently- She was seen 2 days ago at [facility] and per her report had labs drawn and given referrals

## 2016-08-31 NOTE — ED Triage Notes (Addendum)
Pt reports abusing prescription pain medications and benzos for 12 years. Pt requesting detox as well as help with withdrawal symptoms. States the last use was 3 weeks ago. She reports tremors and hallucinations. Pt AOx4 at this time. Denies SI/HI.
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
## 2016-08-31 NOTE — ED Notes (Signed)
Sister to desk- reports that pt was seen at [facility] last pm- sisters are concerned that pt is losing everything and want her to receive help.

## 2016-08-31 NOTE — Discharge Instructions (Signed)
Call any of the numbers listed for help with drug your problem

## 2016-11-28 ENCOUNTER — Ambulatory Visit (HOSPITAL_COMMUNITY)
Admission: RE | Admit: 2016-11-28 | Discharge: 2016-11-28 | Disposition: A | Discharge status: Home / Self Care | Payer: Medicare HMO | Source: Ambulatory Visit | Attending: Adult Health Nurse Practitioner | Admitting: Adult Health Nurse Practitioner

## 2016-11-28 ENCOUNTER — Other Ambulatory Visit (HOSPITAL_COMMUNITY): Payer: Self-pay | Admitting: Adult Health Nurse Practitioner

## 2016-11-28 DIAGNOSIS — J209 Acute bronchitis, unspecified: Secondary | ICD-10-CM

## 2016-11-28 DIAGNOSIS — J449 Chronic obstructive pulmonary disease, unspecified: Secondary | ICD-10-CM | POA: Insufficient documentation

## 2016-11-28 DIAGNOSIS — I700 Atherosclerosis of aorta: Secondary | ICD-10-CM | POA: Diagnosis not present

## 2016-12-04 ENCOUNTER — Other Ambulatory Visit (HOSPITAL_COMMUNITY): Payer: Self-pay | Admitting: Adult Health Nurse Practitioner

## 2016-12-04 DIAGNOSIS — Z1231 Encounter for screening mammogram for malignant neoplasm of breast: Secondary | ICD-10-CM

## 2016-12-09 ENCOUNTER — Ambulatory Visit (HOSPITAL_COMMUNITY): Payer: Self-pay

## 2016-12-26 ENCOUNTER — Ambulatory Visit (HOSPITAL_COMMUNITY)
Admission: RE | Admit: 2016-12-26 | Discharge: 2016-12-26 | Disposition: A | Discharge status: Home / Self Care | Payer: Medicare HMO | Source: Ambulatory Visit | Attending: Adult Health Nurse Practitioner | Admitting: Adult Health Nurse Practitioner

## 2016-12-26 DIAGNOSIS — Z1231 Encounter for screening mammogram for malignant neoplasm of breast: Secondary | ICD-10-CM

## 2017-04-28 ENCOUNTER — Ambulatory Visit (INDEPENDENT_AMBULATORY_CARE_PROVIDER_SITE_OTHER): Payer: Self-pay | Admitting: Otolaryngology

## 2017-06-30 ENCOUNTER — Ambulatory Visit (INDEPENDENT_AMBULATORY_CARE_PROVIDER_SITE_OTHER): Payer: Medicare HMO | Admitting: Otolaryngology

## 2017-06-30 DIAGNOSIS — R220 Localized swelling, mass and lump, head: Secondary | ICD-10-CM | POA: Diagnosis not present

## 2017-07-09 ENCOUNTER — Other Ambulatory Visit: Payer: Self-pay | Admitting: Otolaryngology

## 2017-07-24 ENCOUNTER — Encounter (HOSPITAL_BASED_OUTPATIENT_CLINIC_OR_DEPARTMENT_OTHER): Payer: Self-pay | Admitting: *Deleted

## 2017-07-24 ENCOUNTER — Other Ambulatory Visit: Payer: Self-pay

## 2017-07-24 NOTE — Progress Notes (Signed)
Pt recovering narcotic abuser and requesting no narcotics after surgery

## 2017-07-29 ENCOUNTER — Ambulatory Visit (HOSPITAL_BASED_OUTPATIENT_CLINIC_OR_DEPARTMENT_OTHER): Admission: RE | Admit: 2017-07-29 | Payer: Medicare HMO | Source: Ambulatory Visit | Admitting: Otolaryngology

## 2017-07-29 HISTORY — DX: Gastro-esophageal reflux disease without esophagitis: K21.9

## 2017-07-29 HISTORY — DX: Unspecified osteoarthritis, unspecified site: M19.90

## 2017-07-29 HISTORY — DX: Anxiety disorder, unspecified: F41.9

## 2017-07-29 HISTORY — DX: Depression, unspecified: F32.A

## 2017-07-29 HISTORY — DX: Major depressive disorder, single episode, unspecified: F32.9

## 2017-07-29 HISTORY — DX: Dyspnea, unspecified: R06.00

## 2017-07-29 SURGERY — EXCISION MASS
Anesthesia: General | Laterality: Right

## 2017-08-25 ENCOUNTER — Other Ambulatory Visit: Payer: Self-pay

## 2017-08-25 ENCOUNTER — Encounter (HOSPITAL_BASED_OUTPATIENT_CLINIC_OR_DEPARTMENT_OTHER): Payer: Self-pay | Admitting: *Deleted

## 2017-08-26 ENCOUNTER — Other Ambulatory Visit: Payer: Self-pay | Admitting: Otolaryngology

## 2017-08-29 NOTE — Progress Notes (Signed)
Patient called today stating that her PCP placed her on Levaquin, steroid dose pack and hycodan cough syrup for coughing and congestion. Told patient to let Dr Deeann Saint office know she is sick. Reminded patient to use symbicort inhaler dos and to give herself a Duoneb prior to coming in.

## 2017-09-01 ENCOUNTER — Encounter (HOSPITAL_BASED_OUTPATIENT_CLINIC_OR_DEPARTMENT_OTHER): Payer: Self-pay | Admitting: Anesthesiology

## 2017-09-01 ENCOUNTER — Ambulatory Visit (HOSPITAL_BASED_OUTPATIENT_CLINIC_OR_DEPARTMENT_OTHER): Payer: Medicare HMO | Admitting: Anesthesiology

## 2017-09-01 ENCOUNTER — Other Ambulatory Visit: Payer: Self-pay

## 2017-09-01 ENCOUNTER — Encounter (HOSPITAL_BASED_OUTPATIENT_CLINIC_OR_DEPARTMENT_OTHER)
Admission: RE | Disposition: A | Discharge status: Home / Self Care | Payer: Self-pay | Source: Ambulatory Visit | Attending: Otolaryngology

## 2017-09-01 ENCOUNTER — Ambulatory Visit (HOSPITAL_BASED_OUTPATIENT_CLINIC_OR_DEPARTMENT_OTHER)
Admission: RE | Admit: 2017-09-01 | Discharge: 2017-09-01 | Disposition: A | Discharge status: Home / Self Care | Payer: Medicare HMO | Source: Ambulatory Visit | Attending: Otolaryngology | Admitting: Otolaryngology

## 2017-09-01 DIAGNOSIS — K219 Gastro-esophageal reflux disease without esophagitis: Secondary | ICD-10-CM | POA: Diagnosis not present

## 2017-09-01 DIAGNOSIS — F17210 Nicotine dependence, cigarettes, uncomplicated: Secondary | ICD-10-CM | POA: Insufficient documentation

## 2017-09-01 DIAGNOSIS — J439 Emphysema, unspecified: Secondary | ICD-10-CM | POA: Diagnosis not present

## 2017-09-01 DIAGNOSIS — L720 Epidermal cyst: Secondary | ICD-10-CM | POA: Diagnosis not present

## 2017-09-01 DIAGNOSIS — D210 Benign neoplasm of connective and other soft tissue of head, face and neck: Secondary | ICD-10-CM | POA: Diagnosis not present

## 2017-09-01 DIAGNOSIS — R220 Localized swelling, mass and lump, head: Secondary | ICD-10-CM | POA: Diagnosis present

## 2017-09-01 DIAGNOSIS — I10 Essential (primary) hypertension: Secondary | ICD-10-CM | POA: Diagnosis not present

## 2017-09-01 HISTORY — PX: EXCISION MASS HEAD: SHX6702

## 2017-09-01 SURGERY — EXCISION, MASS, HEAD
Anesthesia: General | Site: Face | Laterality: Right

## 2017-09-01 MED ORDER — FENTANYL CITRATE (PF) 100 MCG/2ML IJ SOLN
INTRAMUSCULAR | Status: AC
  Filled 2017-09-01: qty 2

## 2017-09-01 MED ORDER — ONDANSETRON HCL 4 MG/2ML IJ SOLN: 4.0000 mg | Freq: Once | INTRAMUSCULAR | Status: DC | PRN

## 2017-09-01 MED ORDER — ONDANSETRON HCL 4 MG/2ML IJ SOLN
INTRAMUSCULAR | Status: DC | PRN
  Administered 2017-09-01: 4 mg via INTRAVENOUS

## 2017-09-01 MED ORDER — SCOPOLAMINE 1 MG/3DAYS TD PT72: 1.0000 | MEDICATED_PATCH | Freq: Once | TRANSDERMAL | Status: DC | PRN

## 2017-09-01 MED ORDER — DEXAMETHASONE SODIUM PHOSPHATE 4 MG/ML IJ SOLN
INTRAMUSCULAR | Status: DC | PRN
  Administered 2017-09-01: 10 mg via INTRAVENOUS

## 2017-09-01 MED ORDER — LIDOCAINE-EPINEPHRINE 1 %-1:100000 IJ SOLN
INTRAMUSCULAR | Status: DC | PRN
  Administered 2017-09-01: 1 mL

## 2017-09-01 MED ORDER — FENTANYL CITRATE (PF) 100 MCG/2ML IJ SOLN
50.0000 ug | INTRAMUSCULAR | Status: DC | PRN
  Administered 2017-09-01: 50 ug via INTRAVENOUS

## 2017-09-01 MED ORDER — MIDAZOLAM HCL 2 MG/2ML IJ SOLN
1.0000 mg | INTRAMUSCULAR | Status: DC | PRN
  Administered 2017-09-01: 2 mg via INTRAVENOUS

## 2017-09-01 MED ORDER — CLINDAMYCIN PHOSPHATE 600 MG/50ML IV SOLN
INTRAVENOUS | Status: AC
  Filled 2017-09-01: qty 50

## 2017-09-01 MED ORDER — MIDAZOLAM HCL 2 MG/2ML IJ SOLN
INTRAMUSCULAR | Status: AC
  Filled 2017-09-01: qty 2

## 2017-09-01 MED ORDER — IPRATROPIUM-ALBUTEROL 0.5-2.5 (3) MG/3ML IN SOLN
3.0000 mL | Freq: Once | RESPIRATORY_TRACT | Status: AC
  Administered 2017-09-01: 3 mL via RESPIRATORY_TRACT

## 2017-09-01 MED ORDER — LACTATED RINGERS IV SOLN
INTRAVENOUS | Status: DC
  Administered 2017-09-01: 10:00:00 via INTRAVENOUS

## 2017-09-01 MED ORDER — LIDOCAINE 2% (20 MG/ML) 5 ML SYRINGE
INTRAMUSCULAR | Status: DC | PRN
Start: 2017-09-01 — End: 2017-09-01
  Administered 2017-09-01: 50 mg via INTRAVENOUS

## 2017-09-01 MED ORDER — PROPOFOL 10 MG/ML IV BOLUS
INTRAVENOUS | Status: DC | PRN
  Administered 2017-09-01: 150 mg via INTRAVENOUS

## 2017-09-01 MED ORDER — FENTANYL CITRATE (PF) 100 MCG/2ML IJ SOLN
25.0000 ug | INTRAMUSCULAR | Status: DC | PRN
  Administered 2017-09-01: 50 ug via INTRAVENOUS

## 2017-09-01 SURGICAL SUPPLY — 61 items
ADH SKN CLS APL DERMABOND .7 (GAUZE/BANDAGES/DRESSINGS) ×1
ATTRACTOMAT 16X20 MAGNETIC DRP (DRAPES) IMPLANT
BLADE SURG 15 STRL LF DISP TIS (BLADE) IMPLANT
BLADE SURG 15 STRL SS (BLADE)
CANISTER SUCT 1200ML W/VALVE (MISCELLANEOUS) ×3 IMPLANT
CORD BIPOLAR FORCEPS 12FT (ELECTRODE) IMPLANT
COVER BACK TABLE 60X90IN (DRAPES) ×3 IMPLANT
COVER MAYO STAND STRL (DRAPES) ×3 IMPLANT
DECANTER SPIKE VIAL GLASS SM (MISCELLANEOUS) IMPLANT
DERMABOND ADVANCED (GAUZE/BANDAGES/DRESSINGS) ×2
DERMABOND ADVANCED .7 DNX12 (GAUZE/BANDAGES/DRESSINGS) ×1 IMPLANT
DRAIN JACKSON RD 7FR 3/32 (WOUND CARE) IMPLANT
DRAIN PENROSE 1/4X12 LTX STRL (WOUND CARE) IMPLANT
DRAIN TLS ROUND 10FR (DRAIN) IMPLANT
DRAPE U-SHAPE 76X120 STRL (DRAPES) ×3 IMPLANT
ELECT COATED BLADE 2.86 ST (ELECTRODE) ×3 IMPLANT
ELECT NDL BLADE 2-5/6 (NEEDLE) IMPLANT
ELECT NEEDLE BLADE 2-5/6 (NEEDLE) IMPLANT
ELECT PAIRED SUBDERMAL (MISCELLANEOUS)
ELECT REM PT RETURN 9FT ADLT (ELECTROSURGICAL) ×3
ELECTRODE PAIRED SUBDERMAL (MISCELLANEOUS) IMPLANT
ELECTRODE REM PT RTRN 9FT ADLT (ELECTROSURGICAL) ×1 IMPLANT
EVACUATOR SILICONE 100CC (DRAIN) IMPLANT
FORCEPS BIPOLAR SPETZLER 8 1.0 (NEUROSURGERY SUPPLIES) IMPLANT
GAUZE SPONGE 4X4 12PLY STRL LF (GAUZE/BANDAGES/DRESSINGS) IMPLANT
GAUZE SPONGE 4X4 16PLY XRAY LF (GAUZE/BANDAGES/DRESSINGS) IMPLANT
GLOVE BIO SURGEON STRL SZ7.5 (GLOVE) ×3 IMPLANT
GOWN STRL REUS W/ TWL LRG LVL3 (GOWN DISPOSABLE) ×2 IMPLANT
GOWN STRL REUS W/TWL LRG LVL3 (GOWN DISPOSABLE) ×6
HEMOSTAT SURGICEL 2X14 (HEMOSTASIS) IMPLANT
LOCATOR NERVE 3 VOLT (DISPOSABLE) IMPLANT
NDL HYPO 25X1 1.5 SAFETY (NEEDLE) ×1 IMPLANT
NEEDLE HYPO 25X1 1.5 SAFETY (NEEDLE) ×3 IMPLANT
NS IRRIG 1000ML POUR BTL (IV SOLUTION) ×3 IMPLANT
PACK BASIN DAY SURGERY FS (CUSTOM PROCEDURE TRAY) ×3 IMPLANT
PENCIL BUTTON HOLSTER BLD 10FT (ELECTRODE) ×3 IMPLANT
PIN SAFETY STERILE (MISCELLANEOUS) IMPLANT
PROBE NERVBE PRASS .33 (MISCELLANEOUS) IMPLANT
SHEARS HARMONIC 9CM CVD (BLADE) IMPLANT
SLEEVE SCD COMPRESS KNEE MED (MISCELLANEOUS) IMPLANT
SPONGE GAUZE 2X2 8PLY STER LF (GAUZE/BANDAGES/DRESSINGS)
SPONGE GAUZE 2X2 8PLY STRL LF (GAUZE/BANDAGES/DRESSINGS) IMPLANT
SUCTION FRAZIER HANDLE 10FR (MISCELLANEOUS)
SUCTION TUBE FRAZIER 10FR DISP (MISCELLANEOUS) IMPLANT
SUT ETHILON 3 0 PS 1 (SUTURE) IMPLANT
SUT ETHILON 5 0 P 3 18 (SUTURE)
SUT NYLON ETHILON 5-0 P-3 1X18 (SUTURE) IMPLANT
SUT PROLENE 4 0 P 3 18 (SUTURE) IMPLANT
SUT SILK 3 0 TIES 17X18 (SUTURE)
SUT SILK 3-0 18XBRD TIE BLK (SUTURE) IMPLANT
SUT SILK 4 0 TIES 17X18 (SUTURE) IMPLANT
SUT VIC AB 3-0 FS2 27 (SUTURE) IMPLANT
SUT VIC AB 4-0 P-3 18XBRD (SUTURE) IMPLANT
SUT VIC AB 4-0 P3 18 (SUTURE)
SUT VICRYL 4-0 PS2 18IN ABS (SUTURE) ×3 IMPLANT
SYR BULB 3OZ (MISCELLANEOUS) ×3 IMPLANT
SYR CONTROL 10ML LL (SYRINGE) ×3 IMPLANT
TOWEL OR 17X24 6PK STRL BLUE (TOWEL DISPOSABLE) ×3 IMPLANT
TUBE CONNECTING 20'X1/4 (TUBING) ×1
TUBE CONNECTING 20X1/4 (TUBING) ×2 IMPLANT
YANKAUER SUCT BULB TIP NO VENT (SUCTIONS) IMPLANT

## 2017-09-01 NOTE — Anesthesia Procedure Notes (Signed)
Procedure Name: LMA Insertion Date/Time: 09/01/2017 10:40 AM Performed by: Lieutenant Diego, CRNA Pre-anesthesia Checklist: Patient identified, Emergency Drugs available, Suction available and Patient being monitored Patient Re-evaluated:Patient Re-evaluated prior to induction Oxygen Delivery Method: Circle system utilized Preoxygenation: Pre-oxygenation with 100% oxygen Induction Type: IV induction Ventilation: Mask ventilation without difficulty LMA: LMA inserted LMA Size: 4.0 Number of attempts: 1 Airway Equipment and Method: Bite block Placement Confirmation: positive ETCO2 and breath sounds checked- equal and bilateral Tube secured with: Tape Dental Injury: Teeth and Oropharynx as per pre-operative assessment

## 2017-09-01 NOTE — Anesthesia Postprocedure Evaluation (Signed)
Anesthesia Post Note  Patient: Melanie Santiago  Procedure(s) Performed: EXCISION RIGHT FACIAL MASS (Right Face)     Patient location during evaluation: PACU Anesthesia Type: General Level of consciousness: awake and alert Pain management: pain level controlled Vital Signs Assessment: post-procedure vital signs reviewed and stable Respiratory status: spontaneous breathing, nonlabored ventilation and respiratory function stable Cardiovascular status: blood pressure returned to baseline and stable Postop Assessment: no apparent nausea or vomiting Anesthetic complications: no    Last Vitals:  Vitals:   09/01/17 1141 09/01/17 1200  BP:  139/71  Pulse: 62 80  Resp: 11 16  Temp:  36.7 C  SpO2: 97% 97%    Last Pain:  Vitals:   09/01/17 1200  TempSrc:   PainSc: 3                  Catalina Gravel

## 2017-09-01 NOTE — Transfer of Care (Signed)
Immediate Anesthesia Transfer of Care Note  Patient: Melanie Santiago  Procedure(s) Performed: EXCISION RIGHT FACIAL MASS (Right Face)  Patient Location: PACU  Anesthesia Type:General  Level of Consciousness: awake  Airway & Oxygen Therapy: Patient Spontanous Breathing and Patient connected to face mask oxygen  Post-op Assessment: Report given to RN and Post -op Vital signs reviewed and stable  Post vital signs: Reviewed and stable  Last Vitals:  Vitals:   09/01/17 0919  BP: (!) 146/82  Pulse: 68  Resp: 20  Temp: 36.7 C  SpO2: 99%    Last Pain:  Vitals:   09/01/17 0919  TempSrc: Oral         Complications: No apparent anesthesia complications

## 2017-09-01 NOTE — Discharge Instructions (Addendum)
The patient may resume all her previous activities and diet. She will follow-up in my office in one week.  Call your surgeon if you experience:   1.  Fever over 101.0. 2.  Inability to urinate. 3.  Nausea and/or vomiting. 4.  Extreme swelling or bruising at the surgical site. 5.  Continued bleeding from the incision. 6.  Increased pain, redness or drainage from the incision. 7.  Problems related to your pain medication. 8.  Any problems and/or concerns   Post Anesthesia Home Care Instructions  Activity: Get plenty of rest for the remainder of the day. A responsible individual must stay with you for 24 hours following the procedure.  For the next 24 hours, DO NOT: -Drive a car -Paediatric nurse -Drink alcoholic beverages -Take any medication unless instructed by your physician -Make any legal decisions or sign important papers.  Meals: Start with liquid foods such as gelatin or soup. Progress to regular foods as tolerated. Avoid greasy, spicy, heavy foods. If nausea and/or vomiting occur, drink only clear liquids until the nausea and/or vomiting subsides. Call your physician if vomiting continues.  Special Instructions/Symptoms: Your throat may feel dry or sore from the anesthesia or the breathing tube placed in your throat during surgery. If this causes discomfort, gargle with warm salt water. The discomfort should disappear within 24 hours.  If you had a scopolamine patch placed behind your ear for the management of post- operative nausea and/or vomiting:  1. The medication in the patch is effective for 72 hours, after which it should be removed.  Wrap patch in a tissue and discard in the trash. Wash hands thoroughly with soap and water. 2. You may remove the patch earlier than 72 hours if you experience unpleasant side effects which may include dry mouth, dizziness or visual disturbances. 3. Avoid touching the patch. Wash your hands with soap and water after contact with the  patch.

## 2017-09-01 NOTE — Anesthesia Preprocedure Evaluation (Addendum)
Anesthesia Evaluation  Patient identified by MRN, date of birth, ID band Patient awake    Reviewed: Allergy & Precautions, NPO status , Patient's Chart, lab work & pertinent test results  Airway Mallampati: II  TM Distance: >3 FB Neck ROM: Full    Dental  (+) Dental Advisory Given, Partial Upper, Missing   Pulmonary COPD,  COPD inhaler, Recent URI , Residual Cough, Current Smoker,     + decreased breath sounds+ wheezing      Cardiovascular hypertension, (-) angina(-) CAD and (-) Past MI Normal cardiovascular exam Rhythm:Regular Rate:Normal     Neuro/Psych PSYCHIATRIC DISORDERS Anxiety Depression  Neuromuscular disease    GI/Hepatic GERD  Medicated,(+)     substance abuse (h/o opioid abuse)  ,   Endo/Other  negative endocrine ROS  Renal/GU negative Renal ROS     Musculoskeletal  (+) Arthritis , Osteoarthritis,    Abdominal   Peds  Hematology negative hematology ROS (+)   Anesthesia Other Findings Day of surgery medications reviewed with the patient.  Reproductive/Obstetrics                            Anesthesia Physical Anesthesia Plan  ASA: III  Anesthesia Plan: General   Post-op Pain Management:    Induction: Intravenous  PONV Risk Score and Plan: 3 and Midazolam, Dexamethasone and Ondansetron  Airway Management Planned: LMA  Additional Equipment:   Intra-op Plan:   Post-operative Plan: Extubation in OR  Informed Consent: I have reviewed the patients History and Physical, chart, labs and discussed the procedure including the risks, benefits and alternatives for the proposed anesthesia with the patient or authorized representative who has indicated his/her understanding and acceptance.   Dental advisory given  Plan Discussed with: CRNA  Anesthesia Plan Comments:        Anesthesia Quick Evaluation

## 2017-09-01 NOTE — Op Note (Signed)
DATE OF PROCEDURE:  09/01/2017                              OPERATIVE REPORT  SURGEON:  Leta Baptist, MD  PREOPERATIVE DIAGNOSES: 1. Right facial mass  POSTOPERATIVE DIAGNOSES: 1. Right facial mass  PROCEDURE PERFORMED:  Excision of of right facial mass (CPT 21012)  ANESTHESIA:  General laryngeal mass anesthesia.  COMPLICATIONS:  None.  ESTIMATED BLOOD LOSS:  Minimal.  INDICATION FOR PROCEDURE:  Melanie Santiago is a 57 y.o. female with a history of an enlarging right facial mass. On examination, the patient was noted to have a 3 cm subcutaneous mass over the right parotid gland. The patient has no facial nerve weakness. She was interested in having the mass removed. Based on the above findings, the decision was made for the patient to undergo the above-stated procedure.  The risks, benefits, alternatives, and details of the procedure were discussed with the patient.  Questions were invited and answered.  Informed consent was obtained.  DESCRIPTION:  The patient was taken to the operating room and placed supine on the operating table.  General laryngeal mass anesthesia was administered by the anesthesiologist.  The patient was positioned and prepped and draped in a standard fashion for facial surgery.   The patient was noted to have a 3 cm right preauricular soft tissue mass. 1% lidocaine with 1-100,000 epinephrine was infiltrated at the planned site of incision. A 5 cm vertical incision was made within the right preauricular crease. A subcutaneous flap was elevated over the soft tissue mass. The mass was carefully dissected free from the surrounding soft tissue and the parotid gland. Sebaceous material was expressed once the mass was ruptured. The mass was sent to the pathology department for permanent histologic identification. The surgical site was copiously irrigated. The incision was closed in layers with 4-0 Vicryl and Dermabond.  The care of the patient was turned over to the  anesthesiologist.  The patient was awakened from anesthesia without difficulty.  The patient was extubated and transferred to the recovery room in good condition.  OPERATIVE FINDINGS:  Right facial sebaceous cyst  SPECIMEN:  Right facial sebaceous cyst  FOLLOWUP CARE:  The patient will be discharged home once awake and alert. She may use Tylenol with oxycodone when necessary for pain. The patient will follow up in my office in approximately 2 weeks.  Aldrin Engelhard W Ermin Parisien 09/01/2017 11:19 AM

## 2017-09-01 NOTE — H&P (Signed)
Cc: Right facial mass  HPI: The patient is a 57 y/o female who presents today for evaluation of a mass on her face. The patient is seen in consultation requested by [facility] at [place]. The patient has noted a cyst along the right side of face for many years. It has gotten slightly larger in the past few years. No pain or redness has been noted. The patient denies any difficulty eating or drinking. She has not had any imaging of the area. No previous ENT surgery is noted.   The patient's review of systems (constitutional, eyes, ENT, cardiovascular, respiratory, GI, musculoskeletal, skin, neurologic, psychiatric, endocrine, hematologic, allergic) is noted in the ROS questionnaire.  It is reviewed with the patient.   Family health history: Cancer.  Major events: Hysterectomy, gallbladder removed, ovarian cyst  removed.  Ongoing medical problems: COPD, hypertension, chronic back pain, reflux, scoliosis, fibromyalgia, emphysema.  Social history: The patient is a widow. She denies the use of alcohol or illegal drugs. She smokes one pack of cigarettes a day.   Exam: General: Communicates without difficulty, well nourished, no acute distress. Head: Normocephalic, no evidence injury, no tenderness, facial buttresses intact without stepoff. Eyes: PERRL, EOMI. No scleral icterus, conjunctivae clear. Neuro: CN II exam reveals vision grossly intact.  No nystagmus at any point of gaze. Ears: Auricles well formed without lesions.  Ear canals are intact without mass or lesion.  No erythema or edema is appreciated.  The TMs are intact without fluid. Nose: External evaluation reveals normal support and skin without lesions.  Dorsum is intact.  Anterior rhinoscopy reveals healthy pink mucosa over anterior aspect of inferior turbinates and intact septum.  No purulence noted. Oral:  Oral cavity and oropharynx are intact, symmetric, without erythema or edema.  Mucosa is moist without lesions. Neck: Full range of motion  without pain.  There is no significant lymphadenopathy.  No masses palpable.  Thyroid bed within normal limits to palpation.  Parotid glands and submandibular glands equal bilaterally without mass.  Trachea is midline. A 3cm subcutaneous mass is noted over the right parotid gland. Neuro:  CN 2-12 grossly intact. Gait normal. Vestibular: No nystagmus at any point of gaze. The cerebellar examination is unremarkable.    Assessment 1. A 3 cm subcutaneous mass is noted over the right parotid gland. Differential includes a cyst versus a lipoma.   Plan  1. The physical exam findings are reviewed with the patient. Differential diagnosis is also discussed.  2. Treatment options include observation versus excision. The risks, benefits, alternatives, and details of the procedure are reviewed with the patient. Questions are invited and answered. 3. The patient is interested in proceeding with the procedure.  We will schedule the procedure in accordance with the family schedule.

## 2017-09-02 ENCOUNTER — Encounter (HOSPITAL_BASED_OUTPATIENT_CLINIC_OR_DEPARTMENT_OTHER): Payer: Self-pay | Admitting: Otolaryngology

## 2017-09-08 ENCOUNTER — Ambulatory Visit (INDEPENDENT_AMBULATORY_CARE_PROVIDER_SITE_OTHER): Payer: Self-pay | Admitting: Otolaryngology

## 2017-09-11 ENCOUNTER — Ambulatory Visit (INDEPENDENT_AMBULATORY_CARE_PROVIDER_SITE_OTHER): Payer: Medicare HMO | Admitting: Otolaryngology

## 2017-12-15 ENCOUNTER — Encounter: Payer: Self-pay | Admitting: Internal Medicine

## 2018-02-03 ENCOUNTER — Other Ambulatory Visit (HOSPITAL_COMMUNITY): Payer: Self-pay | Admitting: Adult Health Nurse Practitioner

## 2018-02-03 DIAGNOSIS — Z1231 Encounter for screening mammogram for malignant neoplasm of breast: Secondary | ICD-10-CM

## 2018-03-04 ENCOUNTER — Ambulatory Visit (HOSPITAL_COMMUNITY): Payer: Self-pay

## 2018-03-17 ENCOUNTER — Ambulatory Visit: Payer: Medicare HMO | Admitting: Nurse Practitioner

## 2018-03-17 ENCOUNTER — Encounter

## 2020-10-04 ENCOUNTER — Emergency Department (HOSPITAL_COMMUNITY): Payer: Medicare PPO

## 2020-10-04 ENCOUNTER — Other Ambulatory Visit: Payer: Self-pay

## 2020-10-04 ENCOUNTER — Encounter (HOSPITAL_COMMUNITY): Payer: Self-pay

## 2020-10-04 ENCOUNTER — Emergency Department (HOSPITAL_COMMUNITY)
Admission: EM | Admit: 2020-10-04 | Discharge: 2020-10-04 | Disposition: A | Discharge status: Home / Self Care | Payer: Medicare PPO | Attending: Emergency Medicine | Admitting: Emergency Medicine

## 2020-10-04 DIAGNOSIS — F17210 Nicotine dependence, cigarettes, uncomplicated: Secondary | ICD-10-CM | POA: Insufficient documentation

## 2020-10-04 DIAGNOSIS — D696 Thrombocytopenia, unspecified: Secondary | ICD-10-CM | POA: Insufficient documentation

## 2020-10-04 DIAGNOSIS — Z20822 Contact with and (suspected) exposure to covid-19: Secondary | ICD-10-CM | POA: Insufficient documentation

## 2020-10-04 DIAGNOSIS — R079 Chest pain, unspecified: Secondary | ICD-10-CM | POA: Diagnosis not present

## 2020-10-04 DIAGNOSIS — Z856 Personal history of leukemia: Secondary | ICD-10-CM | POA: Insufficient documentation

## 2020-10-04 DIAGNOSIS — D709 Neutropenia, unspecified: Secondary | ICD-10-CM | POA: Insufficient documentation

## 2020-10-04 DIAGNOSIS — R0602 Shortness of breath: Secondary | ICD-10-CM

## 2020-10-04 DIAGNOSIS — J449 Chronic obstructive pulmonary disease, unspecified: Secondary | ICD-10-CM | POA: Insufficient documentation

## 2020-10-04 HISTORY — DX: Malignant (primary) neoplasm, unspecified: C80.1

## 2020-10-04 LAB — LACTIC ACID, PLASMA: Lactic Acid, Venous: 1.1 mmol/L (ref 0.5–1.9)

## 2020-10-04 LAB — CBC WITH DIFFERENTIAL/PLATELET
Abs Immature Granulocytes: 0 10*3/uL (ref 0.00–0.07)
Basophils Absolute: 0 10*3/uL (ref 0.0–0.1)
Basophils Relative: 0 %
Eosinophils Absolute: 0 10*3/uL (ref 0.0–0.5)
Eosinophils Relative: 0 %
HCT: 28.2 % — ABNORMAL LOW (ref 36.0–46.0)
Hemoglobin: 9.5 g/dL — ABNORMAL LOW (ref 12.0–15.0)
Immature Granulocytes: 0 %
Lymphocytes Relative: 92 %
Lymphs Abs: 0.1 10*3/uL — ABNORMAL LOW (ref 0.7–4.0)
MCH: 28.7 pg (ref 26.0–34.0)
MCHC: 33.7 g/dL (ref 30.0–36.0)
MCV: 85.2 fL (ref 80.0–100.0)
Monocytes Absolute: 0 10*3/uL — ABNORMAL LOW (ref 0.1–1.0)
Monocytes Relative: 8 %
Neutro Abs: 0 10*3/uL — CL (ref 1.7–7.7)
Neutrophils Relative %: 0 %
Platelets: <5 10*3/uL — CL (ref 150–400)
RBC: 3.31 MIL/uL — ABNORMAL LOW (ref 3.87–5.11)
RDW: 14.3 % (ref 11.5–15.5)
WBC: 0.1 10*3/uL — CL (ref 4.0–10.5)
nRBC: 0 % (ref 0.0–0.2)

## 2020-10-04 LAB — COMPREHENSIVE METABOLIC PANEL
ALT: 22 U/L (ref 0–44)
AST: 18 U/L (ref 15–41)
Albumin: 3 g/dL — ABNORMAL LOW (ref 3.5–5.0)
Alkaline Phosphatase: 143 U/L — ABNORMAL HIGH (ref 38–126)
Anion gap: 9 (ref 5–15)
BUN: 16 mg/dL (ref 6–20)
CO2: 26 mmol/L (ref 22–32)
Calcium: 8.2 mg/dL — ABNORMAL LOW (ref 8.9–10.3)
Chloride: 99 mmol/L (ref 98–111)
Creatinine, Ser: 0.5 mg/dL (ref 0.44–1.00)
GFR, Estimated: >60 mL/min (ref >60)
Glucose, Bld: 104 mg/dL — ABNORMAL HIGH (ref 70–99)
Potassium: 3.5 mmol/L (ref 3.5–5.1)
Sodium: 134 mmol/L — ABNORMAL LOW (ref 135–145)
Total Bilirubin: 1 mg/dL (ref 0.3–1.2)
Total Protein: 6.1 g/dL — ABNORMAL LOW (ref 6.5–8.1)

## 2020-10-04 LAB — RESP PANEL BY RT-PCR (FLU A&B, COVID) ARPGX2
Influenza A by PCR: NEGATIVE
Influenza B by PCR: NEGATIVE
SARS Coronavirus 2 by RT PCR: NEGATIVE

## 2020-10-04 LAB — APTT: aPTT: 32 seconds (ref 24–36)

## 2020-10-04 LAB — TYPE AND SCREEN
ABO/RH(D): A POS
Antibody Screen: NEGATIVE

## 2020-10-04 LAB — PROTIME-INR
INR: 1.3 — ABNORMAL HIGH (ref 0.8–1.2)
Prothrombin Time: 16 seconds — ABNORMAL HIGH (ref 11.4–15.2)

## 2020-10-04 LAB — BRAIN NATRIURETIC PEPTIDE: B Natriuretic Peptide: 117 pg/mL — ABNORMAL HIGH (ref 0.0–100.0)

## 2020-10-04 LAB — LIPASE, BLOOD: Lipase: 14 U/L (ref 11–51)

## 2020-10-04 LAB — TROPONIN I (HIGH SENSITIVITY)
Troponin I (High Sensitivity): 7 ng/L (ref <18)
Troponin I (High Sensitivity): 8 ng/L (ref <18)

## 2020-10-04 MED ORDER — SUCRALFATE 1 G PO TABS: 1.0000 g | ORAL_TABLET | Freq: Four times a day (QID) | ORAL | 0 refills | Status: AC | PRN

## 2020-10-04 MED ORDER — FAMOTIDINE IN NACL 20-0.9 MG/50ML-% IV SOLN
20.0000 mg | Freq: Once | INTRAVENOUS | Status: AC
  Administered 2020-10-04: 20 mg via INTRAVENOUS
  Filled 2020-10-04: qty 50

## 2020-10-04 MED ORDER — ONDANSETRON HCL 4 MG/2ML IJ SOLN
4.0000 mg | Freq: Once | INTRAMUSCULAR | Status: AC
  Administered 2020-10-04: 4 mg via INTRAVENOUS
  Filled 2020-10-04: qty 2

## 2020-10-04 MED ORDER — FENTANYL CITRATE (PF) 100 MCG/2ML IJ SOLN
50.0000 ug | Freq: Once | INTRAMUSCULAR | Status: AC
  Administered 2020-10-04: 50 ug via INTRAVENOUS
  Filled 2020-10-04: qty 2

## 2020-10-04 MED ORDER — SODIUM CHLORIDE 0.9 % IV SOLN
10.0000 mL/h | Freq: Once | INTRAVENOUS | Status: AC
  Administered 2020-10-04: 10 mL/h via INTRAVENOUS

## 2020-10-04 MED ORDER — IPRATROPIUM-ALBUTEROL 0.5-2.5 (3) MG/3ML IN SOLN
3.0000 mL | Freq: Once | RESPIRATORY_TRACT | Status: AC
  Administered 2020-10-04: 3 mL via RESPIRATORY_TRACT
  Filled 2020-10-04: qty 3

## 2020-10-04 MED ORDER — FENTANYL CITRATE (PF) 100 MCG/2ML IJ SOLN
25.0000 ug | Freq: Once | INTRAMUSCULAR | Status: AC
  Administered 2020-10-04: 25 ug via INTRAVENOUS
  Filled 2020-10-04: qty 2

## 2020-10-04 MED ORDER — IOHEXOL 350 MG/ML SOLN
100.0000 mL | Freq: Once | INTRAVENOUS | Status: AC | PRN
  Administered 2020-10-04: 100 mL via INTRAVENOUS

## 2020-10-04 MED ORDER — METHYLPREDNISOLONE SODIUM SUCC 125 MG IJ SOLR
125.0000 mg | Freq: Once | INTRAMUSCULAR | Status: AC
  Administered 2020-10-04: 125 mg via INTRAVENOUS
  Filled 2020-10-04: qty 2

## 2020-10-04 NOTE — ED Notes (Signed)
Beeped to [phone] through [facility] @ [place]

## 2020-10-04 NOTE — Discharge Instructions (Signed)
You were evaluated in the Emergency Department and after careful evaluation, we did not find any emergent condition requiring admission or further testing in the hospital.  Your exam/testing today was overall reassuring.  It is important that you keep your follow-up with your hematology/oncology team tomorrow.  Can use the Carafate medication as needed for discomfort.  Please return to the Emergency Department if you experience any worsening of your condition.  Thank you for allowing [place] to be a part of your care.

## 2020-10-04 NOTE — ED Notes (Signed)
Date and time results received: 10/04/20 3:39 AM  Test: WBC Critical Value: 0.1  Test: Absolute Neutrophils  Critical Value: 0.0  Test: Platelets Critical Value: < 5  Name of Provider Notified: Dr. Sedonia Small   Orders Received? Or Actions Taken?:

## 2020-10-04 NOTE — ED Notes (Signed)
Patient transported to CT 

## 2020-10-04 NOTE — ED Triage Notes (Addendum)
Pt arrives from home via [facility] c/o SOB for the past day. Chronically on 3L @ home.    Pt states she used her rescue inhaler at home with no relief

## 2020-10-04 NOTE — ED Provider Notes (Addendum)
[facility] Emergency Department Provider Note MRN:  [id number]  Arrival date & time: 10/04/20     Chief Complaint   Shortness of Breath   History of Present Illness   Melanie Santiago is a 60 y.o. year-old female with a history of COPD presenting to the ED with chief complaint of shortness of breath.  Shortness of breath progressively worsening over the past day.  Denies cough or fever.  Endorsing lower chest pain during this time as well.  Denies abdominal pain, no headache or vision change.  Endorsing increased swelling to the legs.  Patient endorses a new diagnosis of leukemia.  Review of Systems  A complete 10 system review of systems was obtained and all systems are negative except as noted in the HPI and PMH.   Patient's Health History    Past Medical History:  Diagnosis Date  . Anxiety   . Arthritis   . Back pain, chronic   . Cancer ([place])   . COPD (chronic obstructive pulmonary disease) ([place])    diagnosed in 2017  . Depression   . Dyspnea   . GERD (gastroesophageal reflux disease)    takes prilosec  . Opioid abuse [facility])     Past Surgical History:  Procedure Laterality Date  . ABDOMINAL HYSTERECTOMY    . CARPAL TUNNEL RELEASE Left   . CHOLECYSTECTOMY    . CYST REMOVAL HAND    . cyst removed    . EXCISION MASS HEAD Right 09/01/2017   Procedure: EXCISION RIGHT FACIAL MASS;  Surgeon: Leta Baptist, MD;  Location: [place];  Service: ENT;  Laterality: Right;  . OVARIAN CYST REMOVAL      Family History  Problem Relation Age of Onset  . Cancer Mother   . Cancer Sister   . COPD Sister     Social History   Socioeconomic History  . Marital status: Married    Spouse name: Not on file  . Number of children: Not on file  . Years of education: Not on file  . Highest education level: Not on file  Occupational History  . Occupation: housewife  Tobacco Use  . Smoking status: Current Every Day Smoker    Packs/day: 1.00    Years:  33.00    Pack years: 33.00    Types: Cigarettes, E-cigarettes  . Smokeless tobacco: Never Used  Vaping Use  . Vaping Use: Former  Substance and Sexual Activity  . Alcohol use: No  . Drug use: Yes    Types: Hydrocodone    Comment: opioids and benzos - last use 1 year (beginning of March 2018)  . Sexual activity: Never    Birth control/protection: None  Other Topics Concern  . Not on file  Social History Narrative  . Not on file   Social Determinants of Health   Financial Resource Strain: Not on file  Food Insecurity: Not on file  Transportation Needs: Not on file  Physical Activity: Not on file  Stress: Not on file  Social Connections: Not on file  Intimate Partner Violence: Not on file     Physical Exam   Vitals:   10/04/20 0221 10/04/20 0300  BP: 140/70 129/61  Pulse: 95 92  Resp: 20 11  Temp: 99.7 F (37.6 C)   SpO2: 100% 93%    CONSTITUTIONAL: Chronically ill-appearing, in mild respiratory distress NEURO:  Alert and oriented x 3, no focal deficits EYES:  eyes equal and reactive ENT/NECK:  no LAD, no JVD  CARDIO: Regular rate, well-perfused, normal S1 and S2 PULM:  CTAB no wheezing or rhonchi GI/GU:  normal bowel sounds, non-distended, non-tender MSK/SPINE:  No gross deformities, pitting edema to bilateral lower extremities SKIN: Petechia to bilateral lower extremity PSYCH:  Appropriate speech and behavior  *Additional and/or pertinent findings included in MDM below  Diagnostic and Interventional Summary    EKG Interpretation  Date/Time:  Wednesday October 04 2020 02:34:02 EDT Ventricular Rate:  89 PR Interval:  109 QRS Duration: 82 QT Interval:  392 QTC Calculation: 477 R Axis:   80 Text Interpretation: Sinus rhythm Short PR interval Confirmed by Gerlene Fee [phone]) on 10/04/2020 2:38:54 AM      Labs Reviewed  COMPREHENSIVE METABOLIC PANEL - Abnormal; Notable for the following components:      Result Value   Sodium 134 (*)    Glucose, Bld 104  (*)    Calcium 8.2 (*)    Total Protein 6.1 (*)    Albumin 3.0 (*)    Alkaline Phosphatase 143 (*)    All other components within normal limits  PROTIME-INR - Abnormal; Notable for the following components:   Prothrombin Time 16.0 (*)    INR 1.3 (*)    All other components within normal limits  CBC WITH DIFFERENTIAL/PLATELET - Abnormal; Notable for the following components:   WBC 0.1 (*)    RBC 3.31 (*)    Hemoglobin 9.5 (*)    HCT 28.2 (*)    Platelets <5 (*)    Neutro Abs 0.0 (*)    Lymphs Abs 0.1 (*)    Monocytes Absolute 0.0 (*)    All other components within normal limits  CULTURE, BLOOD (SINGLE)  LACTIC ACID, PLASMA  APTT  BRAIN NATRIURETIC PEPTIDE  PATHOLOGIST SMEAR REVIEW  TYPE AND SCREEN  PREPARE PLATELET PHERESIS  TROPONIN I (HIGH SENSITIVITY)  TROPONIN I (HIGH SENSITIVITY)    DG Chest Port 1 View  Final Result      Medications  0.9 %  sodium chloride infusion (has no administration in time range)  famotidine (PEPCID) IVPB 20 mg premix (has no administration in time range)  fentaNYL (SUBLIMAZE) injection 50 mcg (has no administration in time range)  fentaNYL (SUBLIMAZE) injection 25 mcg (25 mcg Intravenous Given 10/04/20 0259)  methylPREDNISolone sodium succinate (SOLU-MEDROL) 125 mg/2 mL injection 125 mg (125 mg Intravenous Given 10/04/20 0301)  ipratropium-albuterol (DUONEB) 0.5-2.5 (3) MG/3ML nebulizer solution 3 mL (3 mLs Nebulization Given 10/04/20 0311)     Procedures  /  Critical Care .Critical Care Performed by: Maudie Flakes, MD Authorized by: Maudie Flakes, MD   Critical care provider statement:    Critical care time (minutes):  34   Critical care was necessary to treat or prevent imminent or life-threatening deterioration of the following conditions: critical neutropenia and thrombocytopenia.   Critical care was time spent personally by me on the following activities:  Discussions with consultants, evaluation of patient's response to treatment,  examination of patient, ordering and performing treatments and interventions, ordering and review of laboratory studies, ordering and review of radiographic studies, pulse oximetry, re-evaluation of patient's condition, obtaining history from patient or surrogate and review of old charts    ED Course and Medical Decision Making  I have reviewed the triage vital signs, the nursing notes, and pertinent available records from the EMR.  Listed above are laboratory and imaging tests that I personally ordered, reviewed, and interpreted and then considered in my medical decision making (see below for details).  Considering COPD exacerbation versus new onset CHF versus pneumothorax versus pneumonia versus PE versus ACS versus blast crisis.  Work-up pending.     Labs reveal absolute neutrophil count of near 0, platelets less than 5.  Patient is followed at [facility] for her leukemia.  We will transfer for admission.  Providing platelet transfusion here.  6 AM update: Discussed case with Dr. Florene Glen of heme-onc at [facility].  Patient's labs are overall not significantly changed.  Platelets are down from 11 to less than 5 and so platelet transfusion is indicated.  However would not admit patient purely for laboratory results.  Overall patient is doing much better, no longer short of breath after Solu-Medrol, DuoNeb.  Chest pain is resolved after Pepcid.  Patient is requesting discharge.  She has follow-up appointment with heme-onc tomorrow.  Patient will receive platelet transfusion, await second troponin.  If no issues and second troponin negative, appropriate for discharge.  Barth Kirks. Sedonia Small, MD [place] [email or web address]  Final Clinical Impressions(s) / ED Diagnoses     ICD-10-CM   1. Neutropenia, unspecified type ([place])  D70.9   2. Thrombocytopenia ([place])  D69.6   3. SOB (shortness of breath)  R06.02   4. Chest pain, unspecified type  R07.9      ED Discharge Orders    None       Discharge Instructions Discussed with and Provided to Patient:   Discharge Instructions   None       Maudie Flakes, MD 10/04/20 0403    Maudie Flakes, MD 10/04/20 [phone]

## 2020-10-04 NOTE — ED Notes (Signed)
ED Provider at bedside. 

## 2020-10-04 NOTE — ED Notes (Signed)
This RN attempted to access R side of double-lumen port. Able to flush line but unable to aspirate blood. Pt then stated "oh they've been having trouble accessing that side for a while." This RN explained that since the line will not aspirate blood, we will have to either access the other side or insert a PIV. Pt acknowledged this. Primary RN to insert PIV.

## 2020-10-05 ENCOUNTER — Encounter (HOSPITAL_COMMUNITY): Payer: Self-pay | Admitting: Emergency Medicine

## 2020-10-05 ENCOUNTER — Other Ambulatory Visit: Payer: Self-pay

## 2020-10-05 ENCOUNTER — Emergency Department (HOSPITAL_COMMUNITY): Payer: Medicare PPO

## 2020-10-05 ENCOUNTER — Emergency Department (HOSPITAL_COMMUNITY)
Admission: EM | Admit: 2020-10-05 | Discharge: 2020-10-06 | Disposition: A | Discharge status: Home / Self Care | Payer: Medicare PPO | Attending: Emergency Medicine | Admitting: Emergency Medicine

## 2020-10-05 DIAGNOSIS — D709 Neutropenia, unspecified: Secondary | ICD-10-CM | POA: Insufficient documentation

## 2020-10-05 DIAGNOSIS — J449 Chronic obstructive pulmonary disease, unspecified: Secondary | ICD-10-CM | POA: Diagnosis not present

## 2020-10-05 DIAGNOSIS — S40021A Contusion of right upper arm, initial encounter: Secondary | ICD-10-CM | POA: Diagnosis not present

## 2020-10-05 DIAGNOSIS — S40022A Contusion of left upper arm, initial encounter: Secondary | ICD-10-CM | POA: Insufficient documentation

## 2020-10-05 DIAGNOSIS — D696 Thrombocytopenia, unspecified: Secondary | ICD-10-CM | POA: Insufficient documentation

## 2020-10-05 DIAGNOSIS — Z20822 Contact with and (suspected) exposure to covid-19: Secondary | ICD-10-CM | POA: Diagnosis not present

## 2020-10-05 DIAGNOSIS — R5081 Fever presenting with conditions classified elsewhere: Secondary | ICD-10-CM

## 2020-10-05 DIAGNOSIS — X58XXXA Exposure to other specified factors, initial encounter: Secondary | ICD-10-CM | POA: Diagnosis not present

## 2020-10-05 DIAGNOSIS — S065X0A Traumatic subdural hemorrhage without loss of consciousness, initial encounter: Secondary | ICD-10-CM | POA: Diagnosis not present

## 2020-10-05 DIAGNOSIS — S065XAA Traumatic subdural hemorrhage with loss of consciousness status unknown, initial encounter: Secondary | ICD-10-CM

## 2020-10-05 DIAGNOSIS — Z7951 Long term (current) use of inhaled steroids: Secondary | ICD-10-CM | POA: Diagnosis not present

## 2020-10-05 DIAGNOSIS — S065X9A Traumatic subdural hemorrhage with loss of consciousness of unspecified duration, initial encounter: Secondary | ICD-10-CM

## 2020-10-05 DIAGNOSIS — F17210 Nicotine dependence, cigarettes, uncomplicated: Secondary | ICD-10-CM | POA: Diagnosis not present

## 2020-10-05 DIAGNOSIS — R000 Tachycardia, unspecified: Secondary | ICD-10-CM | POA: Diagnosis not present

## 2020-10-05 DIAGNOSIS — S0990XA Unspecified injury of head, initial encounter: Secondary | ICD-10-CM | POA: Diagnosis present

## 2020-10-05 DIAGNOSIS — Z859 Personal history of malignant neoplasm, unspecified: Secondary | ICD-10-CM | POA: Diagnosis not present

## 2020-10-05 LAB — COMPREHENSIVE METABOLIC PANEL
ALT: 18 U/L (ref 0–44)
AST: 15 U/L (ref 15–41)
Albumin: 3.1 g/dL — ABNORMAL LOW (ref 3.5–5.0)
Alkaline Phosphatase: 107 U/L (ref 38–126)
Anion gap: 9 (ref 5–15)
BUN: 21 mg/dL — ABNORMAL HIGH (ref 6–20)
CO2: 27 mmol/L (ref 22–32)
Calcium: 8.3 mg/dL — ABNORMAL LOW (ref 8.9–10.3)
Chloride: 99 mmol/L (ref 98–111)
Creatinine, Ser: 0.54 mg/dL (ref 0.44–1.00)
GFR, Estimated: >60 mL/min (ref >60)
Glucose, Bld: 113 mg/dL — ABNORMAL HIGH (ref 70–99)
Potassium: 3 mmol/L — ABNORMAL LOW (ref 3.5–5.1)
Sodium: 135 mmol/L (ref 135–145)
Total Bilirubin: 0.8 mg/dL (ref 0.3–1.2)
Total Protein: 5.9 g/dL — ABNORMAL LOW (ref 6.5–8.1)

## 2020-10-05 LAB — CBC WITH DIFFERENTIAL/PLATELET
Abs Immature Granulocytes: 0 10*3/uL (ref 0.00–0.07)
Basophils Absolute: 0 10*3/uL (ref 0.0–0.1)
Basophils Relative: 0 %
Eosinophils Absolute: 0 10*3/uL (ref 0.0–0.5)
Eosinophils Relative: 0 %
HCT: 25.1 % — ABNORMAL LOW (ref 36.0–46.0)
Hemoglobin: 8.3 g/dL — ABNORMAL LOW (ref 12.0–15.0)
Immature Granulocytes: 0 %
Lymphocytes Relative: 92 %
Lymphs Abs: 0.1 10*3/uL — ABNORMAL LOW (ref 0.7–4.0)
MCH: 28.5 pg (ref 26.0–34.0)
MCHC: 33.1 g/dL (ref 30.0–36.0)
MCV: 86.3 fL (ref 80.0–100.0)
Monocytes Absolute: 0 10*3/uL — ABNORMAL LOW (ref 0.1–1.0)
Monocytes Relative: 8 %
Neutro Abs: 0 10*3/uL — CL (ref 1.7–7.7)
Neutrophils Relative %: 0 %
Platelets: 22 10*3/uL — CL (ref 150–400)
RBC: 2.91 MIL/uL — ABNORMAL LOW (ref 3.87–5.11)
RDW: 14.2 % (ref 11.5–15.5)
WBC: 0.1 10*3/uL — CL (ref 4.0–10.5)
nRBC: 0 % (ref 0.0–0.2)

## 2020-10-05 LAB — PROTIME-INR
INR: 1.2 (ref 0.8–1.2)
Prothrombin Time: 14.9 seconds (ref 11.4–15.2)

## 2020-10-05 LAB — BLOOD GAS, ARTERIAL
Acid-Base Excess: 3.4 mmol/L — ABNORMAL HIGH (ref 0.0–2.0)
Bicarbonate: 27.2 mmol/L (ref 20.0–28.0)
Drawn by: 4627
FIO2: 44
O2 Saturation: 96.2 %
Patient temperature: 37.4
pCO2 arterial: 48.4 mmHg — ABNORMAL HIGH (ref 32.0–48.0)
pH, Arterial: 7.383 (ref 7.350–7.450)
pO2, Arterial: 94.8 mmHg (ref 83.0–108.0)

## 2020-10-05 LAB — BPAM PLATELET PHERESIS
Blood Product Expiration Date: 202204212359
ISSUE DATE / TIME: 202204200533
Unit Type and Rh: 1700

## 2020-10-05 LAB — RESP PANEL BY RT-PCR (FLU A&B, COVID) ARPGX2
Influenza A by PCR: NEGATIVE
Influenza B by PCR: NEGATIVE
SARS Coronavirus 2 by RT PCR: NEGATIVE

## 2020-10-05 LAB — LACTIC ACID, PLASMA
Lactic Acid, Venous: 0.9 mmol/L (ref 0.5–1.9)
Lactic Acid, Venous: 1.1 mmol/L (ref 0.5–1.9)

## 2020-10-05 LAB — PREPARE PLATELET PHERESIS: Unit division: 0

## 2020-10-05 LAB — PATHOLOGIST SMEAR REVIEW

## 2020-10-05 LAB — APTT: aPTT: 28 seconds (ref 24–36)

## 2020-10-05 MED ORDER — ALBUTEROL SULFATE (2.5 MG/3ML) 0.083% IN NEBU
5.0000 mg | INHALATION_SOLUTION | Freq: Once | RESPIRATORY_TRACT | Status: AC
  Administered 2020-10-05: 5 mg via RESPIRATORY_TRACT
  Filled 2020-10-05: qty 6

## 2020-10-05 MED ORDER — SODIUM CHLORIDE 0.9 % IV SOLN
2.0000 g | Freq: Once | INTRAVENOUS | Status: AC
  Administered 2020-10-05: 2 g via INTRAVENOUS
  Filled 2020-10-05: qty 2

## 2020-10-05 MED ORDER — SODIUM CHLORIDE 0.9 % IV SOLN: 2.0000 g | Freq: Two times a day (BID) | INTRAVENOUS | Status: DC

## 2020-10-05 MED ORDER — IPRATROPIUM BROMIDE 0.02 % IN SOLN
0.5000 mg | Freq: Once | RESPIRATORY_TRACT | Status: AC
  Administered 2020-10-05: 0.5 mg via RESPIRATORY_TRACT
  Filled 2020-10-05: qty 2.5

## 2020-10-05 MED ORDER — METRONIDAZOLE IN NACL 5-0.79 MG/ML-% IV SOLN
500.0000 mg | Freq: Once | INTRAVENOUS | Status: AC
  Administered 2020-10-05: 500 mg via INTRAVENOUS
  Filled 2020-10-05: qty 100

## 2020-10-05 MED ORDER — POTASSIUM CHLORIDE 20 MEQ PO PACK: 40.0000 meq | PACK | Freq: Once | ORAL | Status: DC

## 2020-10-05 MED ORDER — VANCOMYCIN HCL IN DEXTROSE 1-5 GM/200ML-% IV SOLN
1000.0000 mg | Freq: Once | INTRAVENOUS | Status: AC
  Administered 2020-10-05: 1000 mg via INTRAVENOUS
  Filled 2020-10-05: qty 200

## 2020-10-05 MED ORDER — LORAZEPAM 2 MG/ML IJ SOLN
1.0000 mg | Freq: Once | INTRAMUSCULAR | Status: AC | PRN
  Administered 2020-10-05: 1 mg via INTRAVENOUS
  Filled 2020-10-05: qty 1

## 2020-10-05 MED ORDER — LACTATED RINGERS IV SOLN: INTRAVENOUS | Status: DC

## 2020-10-05 MED ORDER — LACTATED RINGERS IV BOLUS (SEPSIS)
1000.0000 mL | Freq: Once | INTRAVENOUS | Status: AC
Start: 2020-10-05 — End: 2020-10-05
  Administered 2020-10-05: 1000 mL via INTRAVENOUS

## 2020-10-05 MED ORDER — POTASSIUM CHLORIDE 10 MEQ/100ML IV SOLN
10.0000 meq | INTRAVENOUS | Status: DC
  Administered 2020-10-05 – 2020-10-06 (×2): 10 meq via INTRAVENOUS
  Filled 2020-10-05 (×2): qty 100

## 2020-10-05 MED ORDER — SODIUM CHLORIDE 0.9 % IV SOLN
10.0000 mL/h | Freq: Once | INTRAVENOUS | Status: AC
  Administered 2020-10-05: 10 mL/h via INTRAVENOUS

## 2020-10-05 MED ORDER — ACETAMINOPHEN 325 MG PO TABS
650.0000 mg | ORAL_TABLET | Freq: Once | ORAL | Status: AC
  Administered 2020-10-05: 650 mg via ORAL
  Filled 2020-10-05: qty 2

## 2020-10-05 MED ORDER — VANCOMYCIN HCL 500 MG/100ML IV SOLN: 500.0000 mg | INTRAVENOUS | Status: DC

## 2020-10-05 NOTE — ED Notes (Signed)
Dr. Goldston at bedside.  

## 2020-10-05 NOTE — ED Triage Notes (Signed)
Pt arrived by [facility]. Per EMS pt is here for a head ache. Pt states her family made her come and she does not know why she is here.

## 2020-10-05 NOTE — ED Notes (Signed)
Pt unable to sign MSE at this time.

## 2020-10-05 NOTE — ED Provider Notes (Addendum)
[facility] EMERGENCY DEPARTMENT Provider Note   CSN: [id number] Arrival date & time: 10/05/20  1926  LEVEL 5 CAVEAT - ALTERED MENTAL STATUS  History Chief Complaint  Patient presents with  . Headache    Melanie Santiago is a 60 y.o. female.  HPI 60 year old female presents with headache. When I first asked her why she came to the ER, she tells me her family sent her. She does later tell me she has had a headache since this morning.  She is febrile here but denies having a known fever.  She denies any other type of pain.  She denies shortness of breath besides chronic dyspnea.  When I asked her if she remembers going to [facility] for a platelet transfusion she seems a little confused about what day it is.  She cannot tell me the day of the week but does know the month and the year.  Past Medical History:  Diagnosis Date  . Anxiety   . Arthritis   . Back pain, chronic   . Cancer ([place])   . COPD (chronic obstructive pulmonary disease) ([place])    diagnosed in 2017  . Depression   . Dyspnea   . GERD (gastroesophageal reflux disease)    takes prilosec  . Opioid abuse [facility])     Patient Active Problem List   Diagnosis Date Noted  . DDD (degenerative disc disease), lumbar 03/20/2016  . Radiculopathy due to lumbar intervertebral disc disorder 03/20/2016  . Gastroesophageal reflux disease with esophagitis 03/20/2016  . History of esophageal stricture 03/20/2016  . Cigarette smoker 01/13/2014  . Opiate dependence ([place]) 06/29/2011  . Back pain, chronic 06/29/2011  . Essential hypertension 08/17/2009  . Allergic rhinitis 08/17/2009  . COPD GOLD III with reversibility 08/17/2009  . COUGH 08/17/2009    Past Surgical History:  Procedure Laterality Date  . ABDOMINAL HYSTERECTOMY    . CARPAL TUNNEL RELEASE Left   . CHOLECYSTECTOMY    . CYST REMOVAL HAND    . cyst removed    . EXCISION MASS HEAD Right 09/01/2017   Procedure: EXCISION RIGHT FACIAL MASS;  Surgeon: Leta Baptist, MD;   Location: [place];  Service: ENT;  Laterality: Right;  . OVARIAN CYST REMOVAL       OB History   No obstetric history on file.     Family History  Problem Relation Age of Onset  . Cancer Mother   . Cancer Sister   . COPD Sister     Social History   Tobacco Use  . Smoking status: Current Every Day Smoker    Packs/day: 1.00    Years: 33.00    Pack years: 33.00    Types: Cigarettes, E-cigarettes  . Smokeless tobacco: Never Used  Vaping Use  . Vaping Use: Former  Substance Use Topics  . Alcohol use: No  . Drug use: Yes    Types: Hydrocodone    Comment: opioids and benzos - last use 1 year (beginning of March 2018)    Home Medications Prior to Admission medications   Medication Sig Start Date End Date Taking? Authorizing Provider  albuterol (PROVENTIL HFA;VENTOLIN HFA) 108 (90 Base) MCG/ACT inhaler Inhale 2 puffs into the lungs every 6 (six) hours as needed. For shortness of breath. 07/05/15   Tanda Rockers, MD  budesonide-formoterol (SYMBICORT) 160-4.5 MCG/ACT inhaler Take 2 puffs first thing in am and then another 2 puffs about 12 hours later. Patient taking differently: Inhale 2 puffs into the lungs  2 (two) times daily.  11/18/14   Tanda Rockers, MD  busPIRone (BUSPAR) 10 MG tablet Take 10 mg by mouth 2 (two) times daily.    [provider]  cholecalciferol (VITAMIN D) 1000 units tablet Take 1,000 Units by mouth 2 (two) times daily.    [provider]  DULoxetine (CYMBALTA) 30 MG capsule Take 30 mg by mouth 2 (two) times daily.    [provider]  estradiol (ESTRACE) 2 MG tablet Take 2 mg by mouth daily.    [provider]  gabapentin (NEURONTIN) 400 MG capsule TAKE 1 TO 2 CAPSULES AT BEDTIME Patient taking differently: TAKE 2 CAPSULES BY MOUTH AT BEDTIME 05/30/16   Terald Sleeper, PA-C  HYDROcodone-homatropine (HYCODAN) 5-1.5 MG/5ML syrup Take 5 mLs by mouth every 6 (six) hours as needed for cough.    [provider]  ipratropium-albuterol (DUONEB) 0.5-2.5 (3) MG/3ML SOLN Take 3 mLs by nebulization every 6 (six) hours as needed (for shortness of breath).    [provider]  levofloxacin (LEVAQUIN) 500 MG tablet Take 500 mg by mouth daily.    [provider]  meloxicam (MOBIC) 15 MG tablet Take 15 mg by mouth daily.    [provider]  montelukast (SINGULAIR) 10 MG tablet Take 10 mg by mouth at bedtime.    [provider]  omeprazole (PRILOSEC) 20 MG capsule Take 1 capsule (20 mg total) by mouth 2 (two) times daily before a meal. 03/20/16   Ronnald Ramp, Londell Moh, PA-C  predniSONE (STERAPRED UNI-PAK 21 TAB) 10 MG (21) TBPK tablet Take by mouth daily.    [provider]  sucralfate (CARAFATE) 1 g tablet Take 1 tablet (1 g total) by mouth 4 (four) times daily as needed. 10/04/20   Maudie Flakes, MD  traZODone (DESYREL) 100 MG tablet Take 100 mg by mouth at bedtime.    [provider]  ZUBSOLV 5.7-1.4 MG SUBL Place 1 tablet under the tongue 2 (two) times daily.    [provider]    Allergies    Augmentin [amoxicillin-pot clavulanate] and Sulfa antibiotics  Review of Systems   Review of Systems  Unable to perform ROS: Mental status change    Physical Exam Updated Vital Signs BP (!) 147/94   Pulse (!) 107   Temp 98.4 F (36.9 C) (Oral)   Resp (!) 27   Ht 5\' 5"  (1.651 m)   Wt 42.2 kg   SpO2 97%   BMI 15.48 kg/m   Physical Exam Vitals and nursing note reviewed.  Constitutional:      Appearance: She is well-developed.  HENT:     Head: Normocephalic and atraumatic.     Right Ear: External ear normal.     Left Ear: External ear normal.     Nose: Nose normal.  Eyes:     General:        Right eye: No discharge.        Left eye: No discharge.  Cardiovascular:     Rate and Rhythm: Regular rhythm. Tachycardia present.     Heart sounds: Normal heart sounds.  Pulmonary:     Effort: Pulmonary effort is normal.     Comments:  Diffuse decreased breath sounds Abdominal:     Palpations: Abdomen is soft.     Tenderness: There is no abdominal tenderness.  Musculoskeletal:     Cervical back: Normal range of motion and neck supple. No rigidity.  Skin:    General: Skin is  warm and dry.     Findings: Ecchymosis and petechiae present.     Comments: Diffuse ecchymosis, especially to the bilateral upper extremities.  She has petechiae to the lower extremities.  Neurological:     Mental Status: She is alert.     Comments: Alert, oriented to month/year.  Does not know the day of the week.  She doesn't answer my question when asking which city but knows she's in the hospital  Psychiatric:        Mood and Affect: Mood is not anxious.     ED Results / Procedures / Treatments   Labs (all labs ordered are listed, but only abnormal results are displayed) Labs Reviewed  COMPREHENSIVE METABOLIC PANEL - Abnormal; Notable for the following components:      Result Value   Potassium 3.0 (*)    Glucose, Bld 113 (*)    BUN 21 (*)    Calcium 8.3 (*)    Total Protein 5.9 (*)    Albumin 3.1 (*)    All other components within normal limits  CBC WITH DIFFERENTIAL/PLATELET - Abnormal; Notable for the following components:   WBC 0.1 (*)    RBC 2.91 (*)    Hemoglobin 8.3 (*)    HCT 25.1 (*)    Platelets 22 (*)    Neutro Abs 0.0 (*)    Lymphs Abs 0.1 (*)    Monocytes Absolute 0.0 (*)    All other components within normal limits  RESP PANEL BY RT-PCR (FLU A&B, COVID) [id number]  CULTURE, BLOOD (ROUTINE X 2)  CULTURE, BLOOD (ROUTINE X 2)  URINE CULTURE  LACTIC ACID, PLASMA  LACTIC ACID, PLASMA  PROTIME-INR  APTT  URINALYSIS, ROUTINE W REFLEX MICROSCOPIC  CREATININE, SERUM  BLOOD GAS, ARTERIAL  PREPARE PLATELET PHERESIS    EKG EKG Interpretation  Date/Time:  Thursday October 05 2020 20:41:54 EDT Ventricular Rate:  101 PR Interval:  107 QRS Duration: 92 QT Interval:  385 QTC Calculation: 500 R Axis:   83 Text  Interpretation: Sinus tachycardia Anteroseptal infarct, age indeterminate Interpretation limited secondary to artifact Confirmed by Sherwood Gambler [phone]) on 10/05/2020 9:07:26 PM   Radiology CT Head Wo Contrast  Result Date: 10/05/2020 CLINICAL DATA:  Headache EXAM: CT HEAD WITHOUT CONTRAST TECHNIQUE: Contiguous axial images were obtained from the base of the skull through the vertex without intravenous contrast. Notation from staff that patient only able to lay supine for a few seconds at a time and that these are the best obtainable images. COMPARISON:  None. FINDINGS: Brain: Extensive motion artifact limits the examination. No gross intracranial mass or ventricular enlargement. Appearance of subdural fluid collection on the right, measuring about 15 mm in thickness on axial images with some mass effect on underlying brain parenchyma. Appearance suggest subacute collection though assessment is limited secondary to excessive patient motion. Vascular: No hyperdense vessels.  No unexpected calcification Skull: No gross fracture Sinuses/Orbits: No acute finding. Other: None IMPRESSION: 1. Extensive motion artifact limits the examination. 2. Appearance of subdural fluid collection on the right, measuring about 15 mm in thickness with some mass effect on underlying brain parenchyma. Appearance suggests subacute collection though assessment is limited secondary to excessive patient motion. No obvious hyperdense blood but again very limited secondary to motion. Consider repeat study when the patient is able to tolerate supine position. Electronically Signed   By: Donavan Foil M.D.   On: 10/05/2020 22:04   CT ANGIO CHEST PE W OR WO CONTRAST  Result Date: 10/04/2020  CLINICAL DATA:  Progressively worsening shortness of breath, COPD, lower chest pain, increased lower extremity swelling, recent diagnosis of leukemia as well. EXAM: CT ANGIOGRAPHY CHEST WITH CONTRAST TECHNIQUE: Multidetector CT imaging of the chest  was performed using the standard protocol during bolus administration of intravenous contrast. Multiplanar CT image reconstructions and MIPs were obtained to evaluate the vascular anatomy. CONTRAST:  169mL OMNIPAQUE IOHEXOL 350 MG/ML SOLN COMPARISON:  Radiograph 10/04/2020 FINDINGS: Cardiovascular: Satisfactory opacification the pulmonary arteries to the segmental level. No pulmonary artery filling defects are identified. Central pulmonary arteries are enlarged. Cardiac size is top normal. Coronary artery calcifications are present. Atherosclerotic plaque within the normal caliber aorta. No acute luminal abnormality of the imaged aorta. No periaortic stranding or hemorrhage. The Shared origin of the brachiocephalic and left common carotid arteries. Minimal plaque in the proximal great vessels. No acute abnormality is seen. Tunneled right IJ Port-A-Cath tip terminates near the superior cavoatrial junction. Slight distension of the azygos. No other major venous abnormalities are seen. Mediastinum/Nodes: No mediastinal fluid or gas. Normal thyroid gland and thoracic inlet. No acute abnormality of the trachea or esophagus. Few subcentimeter low-attenuation mediastinal and hilar nodes are favored to be reactive or edematous. No worrisome, pathologically enlarged mediastinal, hilar or axillary adenopathy. Lungs/Pleura: Background of moderate to severe emphysematous changes with bronchitic features. Superimposed upon this changes some additional pulmonary vascular redistribution interlobular septal thickening which could reflect mild superimposed edema. More bandlike areas of atelectasis or scarring are noted in the lung bases. Biapical pleuroparenchymal scarring is noted as well including some lucent bullous change in the left apex. A conspicuous solid 8 mm nodule is seen in the anterior basal segment left lower lobe (6/94). Upper Abdomen: No acute abnormalities present in the visualized portions of the upper abdomen.  Musculoskeletal: Paucity of subcutaneous and mediastinal fat. The osseous structures appear diffusely demineralized which may limit detection of small or nondisplaced fractures. Multilevel degenerative changes are present in the imaged portions of the spine. Exaggerated thoracic kyphosis. No concerning lytic or blastic lesions are seen. Review of the MIP images confirms the above findings. IMPRESSION: 1. No evidence of pulmonary embolism. 2. Severe centrilobular and paraseptal emphysematous changes. Emphysema (ICD10-J43.9). 3. Suspect at least mild superimposed interstitial edema. 4. Spiculated 8 mm nodule in the anterior basal segment left lower lobe. Given patient risk factors and history of known malignancy, Fleischner imaging guidelines do not typically apply. Could consider short-term three-month interval follow-up or referral to multidisciplinary clinic for more targeted outpatient management. 5. Coronary artery atherosclerosis. 6. Central pulmonary artery enlargement, suggestive of pulmonary arterial hypertension. 7. Paucity of subcutaneous and mediastinal fat. 8. Aortic Atherosclerosis (ICD10-I70.0) Electronically Signed   By: Lovena Le M.D.   On: 10/04/2020 05:35   DG Chest Port 1 View  Result Date: 10/05/2020 CLINICAL DATA:  Possible sepsis fever EXAM: PORTABLE CHEST 1 VIEW COMPARISON:  10/04/2020 chest x-ray and CT, chest x-ray 08/08/2016, 11/28/2016 FINDINGS: Advanced emphysema with diffuse increased interstitial opacity. No focal consolidation or pleural effusion. Stable cardiomediastinal silhouette with aortic atherosclerosis. Right-sided central venous catheter tip over the SVC. IMPRESSION: Advanced emphysema. Diffuse increased interstitial opacity could be secondary to acute interstitial edema or inflammation on underlying chronic change. No focal pneumonia Electronically Signed   By: Donavan Foil M.D.   On: 10/05/2020 20:34   DG Chest Port 1 View  Result Date: 10/04/2020 CLINICAL DATA:   Shortness of breath EXAM: PORTABLE CHEST 1 VIEW COMPARISON:  Radiograph 11/28/2016 FINDINGS: Chronic hyperinflation with coarsened interstitial and bronchitic  changes throughout both lungs and biapical pleuroparenchymal scarring. Overall appearance is slightly progressive from comparison exam though may be accentuated by portable technique. No focal consolidative opacity. No pneumothorax or visible effusion. No convincing features of edema. Dual lumen Port-A-Cath tip terminates in the mid SVC. Telemetry leads overlie the chest. The aorta is calcified. The remaining cardiomediastinal contours are unremarkable. The osseous structures appear diffusely demineralized which may limit detection of small or nondisplaced fractures. No acute osseous or soft tissue abnormality. IMPRESSION: Chronic hyperinflation with coarsened interstitial and bronchitic changes, slightly progressed from prior though may be accentuated by portable technique. No other acute cardiopulmonary abnormality. Aortic Atherosclerosis (ICD10-I70.0). Electronically Signed   By: Lovena Le M.D.   On: 10/04/2020 03:19    Procedures .Critical Care Performed by: Sherwood Gambler, MD Authorized by: Sherwood Gambler, MD   Critical care provider statement:    Critical care time (minutes):  60   Critical care time was exclusive of:  Separately billable procedures and treating other patients   Critical care was necessary to treat or prevent imminent or life-threatening deterioration of the following conditions:  Sepsis, respiratory failure and CNS failure or compromise   Critical care was time spent personally by me on the following activities:  Discussions with consultants, evaluation of patient's response to treatment, examination of patient, ordering and performing treatments and interventions, ordering and review of laboratory studies, ordering and review of radiographic studies, pulse oximetry, re-evaluation of patient's condition, obtaining  history from patient or surrogate and review of old charts     Medications Ordered in ED Medications  ceFEPIme (MAXIPIME) 2 g in sodium chloride 0.9 % 100 mL IVPB (has no administration in time range)  vancomycin (VANCOREADY) IVPB 500 mg/100 mL (has no administration in time range)  0.9 %  sodium chloride infusion (has no administration in time range)  potassium chloride (KLOR-CON) packet 40 mEq (has no administration in time range)  potassium chloride 10 mEq in 100 mL IVPB (has no administration in time range)  lactated ringers bolus 1,000 mL (0 mLs Intravenous Stopped 10/05/20 2309)  ceFEPIme (MAXIPIME) 2 g in sodium chloride 0.9 % 100 mL IVPB (0 g Intravenous Stopped 10/05/20 2225)  metroNIDAZOLE (FLAGYL) IVPB 500 mg (0 mg Intravenous Stopped 10/05/20 2224)  vancomycin (VANCOCIN) IVPB 1000 mg/200 mL premix (0 mg Intravenous Stopped 10/05/20 2200)  acetaminophen (TYLENOL) tablet 650 mg (650 mg Oral Given 10/05/20 2027)  LORazepam (ATIVAN) injection 1 mg (1 mg Intravenous Given 10/05/20 2134)  albuterol (PROVENTIL) (2.5 MG/3ML) 0.083% nebulizer solution 5 mg (5 mg Nebulization Given 10/05/20 2238)  ipratropium (ATROVENT) nebulizer solution 0.5 mg (0.5 mg Nebulization Given 10/05/20 2238)    ED Course  I have reviewed the triage vital signs and the nursing notes.  Pertinent labs & imaging results that were available during my care of the patient were reviewed by me and considered in my medical decision making (see chart for details).  Clinical Course as of 10/05/20 2320  Thu Oct 05, 2020  2224 I discussed with Dr. Jerrye Noble. He will accept to [facility] for admission. Asks for 1 unit of platelets. Otherwise continue treatment. [SG]    Clinical Course User Index [SG] Sherwood Gambler, MD   MDM Rules/Calculators/A&P                          Patient is febrile and thus started on broad antibiotics for febrile neutropenia.  No clear source at this time.  Unclear if her headache is  from fever or CNS pathology.  CT head has been personally reviewed and does show an isodense collection that does not show an obvious bleed.  Per chart review she has had a hematoma on this side but report indicates it is probably a little larger right now.  Upon discussion with Dr. Florene Glen as above I will give 1 unit of platelets.  She was noted to be more short of breath on reevaluation and she had taken her oxygen off.  I think she is mildly delirious, likely from the febrile illness itself.  While she may have a CNS infection, LP would be contraindicated given her severe thrombocytopenia.  We will treat with broad antibiotics.  She was given an albuterol neb and seems to be breathing a little easier as she does have significant COPD as well.  At this point, she is awaiting a bed at [facility].  If it takes multiple hours, it would probably be better for her to be admitted here and to keep working on transfer.  Of note, we did have to give her 1 mg ativan to help get the CT done as she had previously refused it. May need repeat CT at [facility] given it was of relatively poor quality.  Final Clinical Impression(s) / ED Diagnoses Final diagnoses:  Neutropenic fever ([place])  Thrombocytopenia ([place])  Subdural hematoma [facility])    Rx / DC Orders ED Discharge Orders    None       Sherwood Gambler, MD 10/05/20 2320    Sherwood Gambler, MD 10/05/20 1601    Sherwood Gambler, MD 10/05/20 2324

## 2020-10-05 NOTE — Progress Notes (Signed)
Pharmacy Antibiotic Note  Melanie Santiago is a 60 y.o. female admitted on 10/05/2020 with unknown source of infection.  Pharmacy has been consulted for Vancomycin and Cefepime dosing.  Plan: Vancomycin 1000 mg IV x 1 dose. Vancomycin 500 mg IV every 24 hours. Cefepime 2000 mg IV every 12 hours. Monitor labs, c/s, and vanco level as indicated.  Height: 5\' 5"  (165.1 cm) Weight: 42.2 kg (93 lb) IBW/kg (Calculated) : 57  Temp (24hrs), Avg:101.8 F (38.8 C), Min:101.8 F (38.8 C), Max:101.8 F (38.8 C)  Recent Labs  Lab 10/04/20 0300  WBC 0.1*  CREATININE 0.50  LATICACIDVEN 1.1    Estimated Creatinine Clearance: 50.4 mL/min (by C-G formula based on SCr of 0.5 mg/dL).    Allergies  Allergen Reactions  . Augmentin [Amoxicillin-Pot Clavulanate] Nausea And Vomiting and Other (See Comments)    Has patient had a PCN reaction causing immediate rash, facial/tongue/throat swelling, SOB or lightheadedness with hypotension: No Has patient had a PCN reaction causing severe rash involving mucus membranes or skin necrosis: No Has patient had a PCN reaction that required hospitalization No Has patient had a PCN reaction occurring within the last 10 years: No If all of the above answers are "NO", then may proceed with Cephalosporin use.  . Sulfa Antibiotics Nausea And Vomiting and Rash    Antimicrobials this admission: Vanco 4/21 >>  Cefepime 4/21 >>  Flagyl 4/21  Microbiology results: 4/12 BCx: pending 4/12 UCx: pending    Thank you for allowing pharmacy to be a part of this patient's care.  Ramond Craver 10/05/2020 8:13 PM

## 2020-10-06 ENCOUNTER — Telehealth (HOSPITAL_BASED_OUTPATIENT_CLINIC_OR_DEPARTMENT_OTHER): Payer: Self-pay | Admitting: Emergency Medicine

## 2020-10-06 DIAGNOSIS — S065X0A Traumatic subdural hemorrhage without loss of consciousness, initial encounter: Secondary | ICD-10-CM | POA: Diagnosis not present

## 2020-10-06 LAB — URINALYSIS, ROUTINE W REFLEX MICROSCOPIC
Bacteria, UA: NONE SEEN
Bilirubin Urine: NEGATIVE
Glucose, UA: NEGATIVE mg/dL
Ketones, ur: NEGATIVE mg/dL
Leukocytes,Ua: NEGATIVE
Nitrite: NEGATIVE
Protein, ur: 30 mg/dL — AB
Specific Gravity, Urine: 1.015 (ref 1.005–1.030)
pH: 8 (ref 5.0–8.0)

## 2020-10-06 NOTE — ED Notes (Signed)
Niece Meade Maw [phone]

## 2020-10-06 NOTE — ED Notes (Signed)
Spoke with patient niece Meade Maw who lives and cares for the patient. Due to pt condition at this time verbal consent was obtained for blood product administration, dual verification with Lyndel Pleasure, RN. Pt niece states the pt has received platelets three times this week Monday, Tuesday, and Thursday.

## 2020-10-07 LAB — BLOOD CULTURE ID PANEL (REFLEXED) - BCID2

## 2020-10-07 LAB — PREPARE PLATELET PHERESIS: Unit division: 0

## 2020-10-07 LAB — BPAM PLATELET PHERESIS
Blood Product Expiration Date: 202204222359
Unit Type and Rh: 6200

## 2020-10-08 LAB — URINE CULTURE: Culture: 3000 — AB

## 2020-10-09 ENCOUNTER — Telehealth: Payer: Self-pay | Admitting: Emergency Medicine

## 2020-10-09 LAB — CULTURE, BLOOD (ROUTINE X 2): Special Requests: ADEQUATE

## 2020-10-09 NOTE — Telephone Encounter (Signed)
Post ED Visit - Positive Culture Follow-up  Culture report reviewed by antimicrobial stewardship pharmacist: [place] Team []  Elenor Quinones, Pharm.D. []  Heide Guile, Pharm.D., BCPS AQ-ID []  Parks Neptune, Pharm.D., BCPS []  Alycia Rossetti, Pharm.D., BCPS []  [place], Pharm.D., BCPS, AAHIVP []  Legrand Como, Pharm.D., BCPS, AAHIVP []  Salome Arnt, PharmD, BCPS []  Johnnette Gourd, PharmD, BCPS []  Hughes Better, PharmD, BCPS []  Leeroy Cha, PharmD []  Laqueta Linden, PharmD, BCPS []  Albertina Parr, PharmD  [place] Team []  Leodis Sias, PharmD []  Lindell Spar, PharmD []  Royetta Asal, PharmD []  Graylin Shiver, Rph []  Rema Fendt) Glennon Mac, PharmD []  Arlyn Dunning, PharmD []  Netta Cedars, PharmD []  Dia Sitter, PharmD []  Leone Haven, PharmD []  Gretta Arab, PharmD []  Theodis Shove, PharmD []  Peggyann Juba, PharmD []  Reuel Boom, PharmD   Positive urine culture Treated with augmentin, pt was transferred to [facility] for treatment  [name] 10/09/2020, 3:15 PM

## 2020-10-10 ENCOUNTER — Telehealth: Payer: Self-pay | Admitting: Emergency Medicine

## 2020-10-10 LAB — CULTURE, BLOOD (SINGLE): Special Requests: ADEQUATE

## 2020-10-10 LAB — CULTURE, BLOOD (ROUTINE X 2): Special Requests: ADEQUATE

## 2020-10-10 NOTE — Telephone Encounter (Signed)
Post ED Visit - Positive Culture Follow-up  Culture report reviewed by antimicrobial stewardship pharmacist: [place] Team []  Elenor Quinones, Pharm.D. []  Heide Guile, Pharm.D., BCPS AQ-ID []  Parks Neptune, Pharm.D., BCPS []  Alycia Rossetti, Pharm.D., BCPS []  [place], Pharm.D., BCPS, AAHIVP []  Legrand Como, Pharm.D., BCPS, AAHIVP []  Salome Arnt, PharmD, BCPS []  Johnnette Gourd, PharmD, BCPS []  Hughes Better, PharmD, BCPS []  Leeroy Cha, PharmD []  Laqueta Linden, PharmD, BCPS []  Albertina Parr, PharmD  [place] Team []  Leodis Sias, PharmD []  Lindell Spar, PharmD []  Royetta Asal, PharmD []  Graylin Shiver, Rph []  Rema Fendt) Glennon Mac, PharmD []  Arlyn Dunning, PharmD []  Netta Cedars, PharmD []  Dia Sitter, PharmD []  Leone Haven, PharmD []  Gretta Arab, PharmD []  Theodis Shove, PharmD []  Peggyann Juba, PharmD []  Reuel Boom, PharmD   Positive blood culture [facility]ferred to [facility] for treatment and no further patient follow-up is required at this time.  [name] 10/10/2020, 12:40 PM

## 2020-10-15 DEATH — deceased

## 2023-01-11 IMAGING — CT CT HEAD W/O CM
3 series · 15 of 47 positions shown, 18 images · non-contrast
Comparison: None.

CLINICAL DATA: Headache

EXAM:
CT HEAD WITHOUT CONTRAST
TECHNIQUE: Contiguous axial images were obtained from the base of the skull
through the vertex without intravenous contrast. Notation from staff
that patient only able to lay supine for a few seconds at a time and
that these are the best obtainable images.

[Series 2: head w o · axial · 0.39mm/px · z∈[+1574,+1704]mm · 9 of 32 slices shown, 12 images]
[im 3/32  brain]
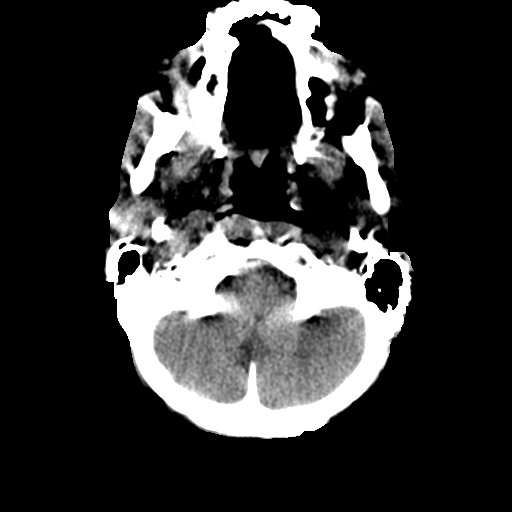
[im 3/32  bone]
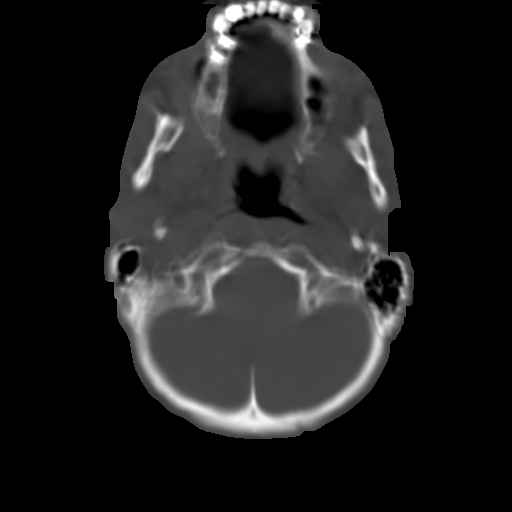
[im 6/32  brain]
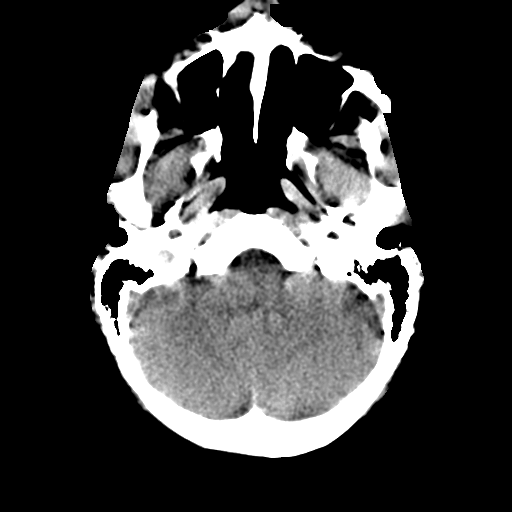
[im 9/32  brain]
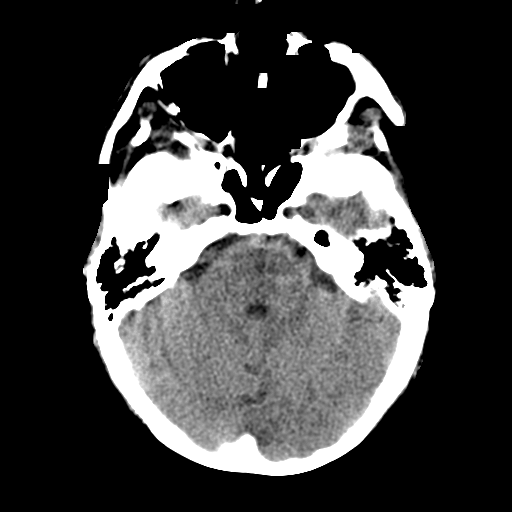
[im 12/32  brain]
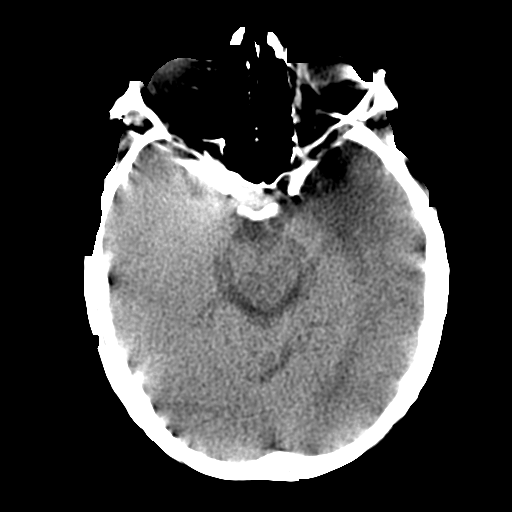
[im 17/32  brain]
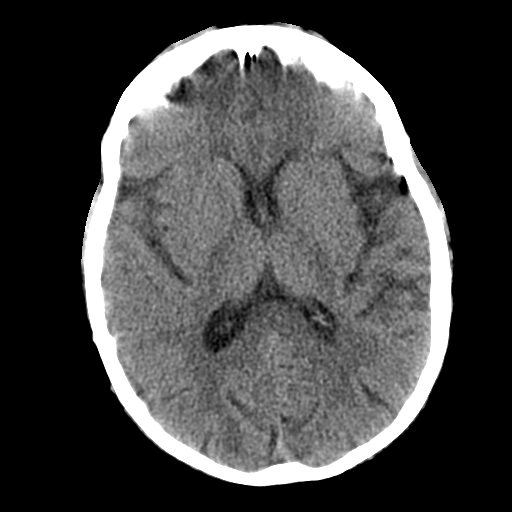
[im 17/32  bone]
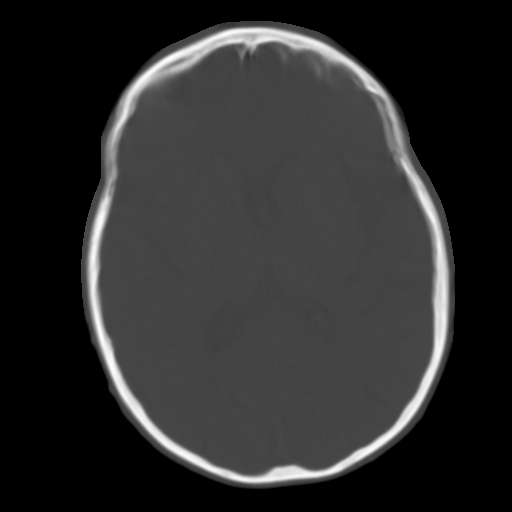
[im 20/32  brain]
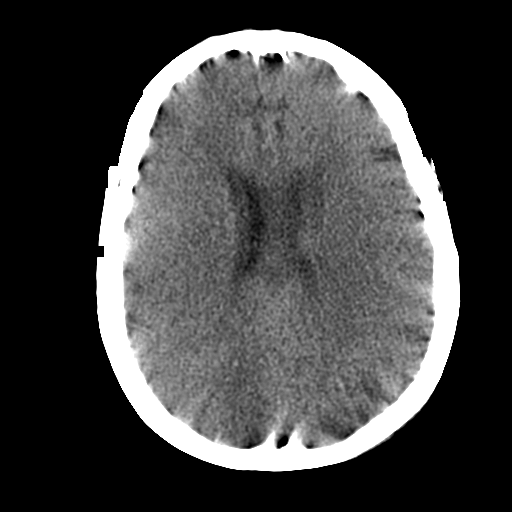
[im 23/32  brain]
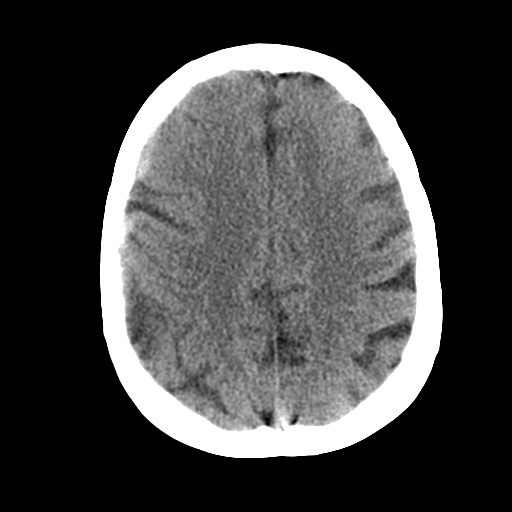
[im 26/32  brain]
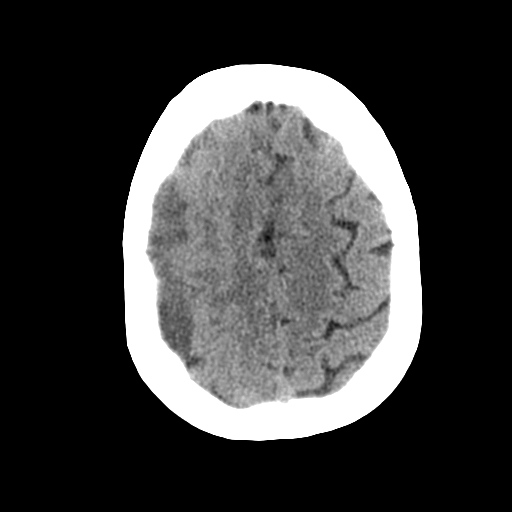
[im 29/32  brain]
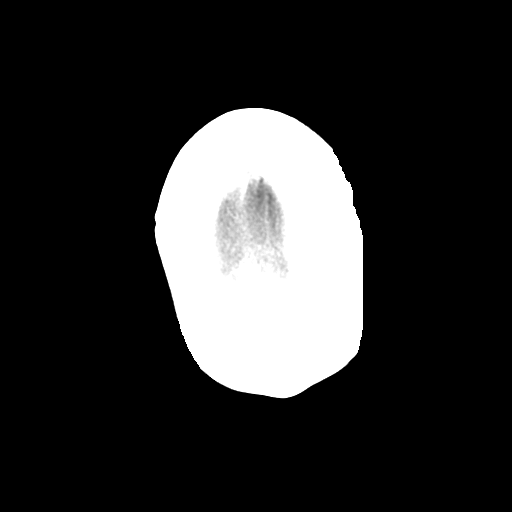
[im 29/32  bone]
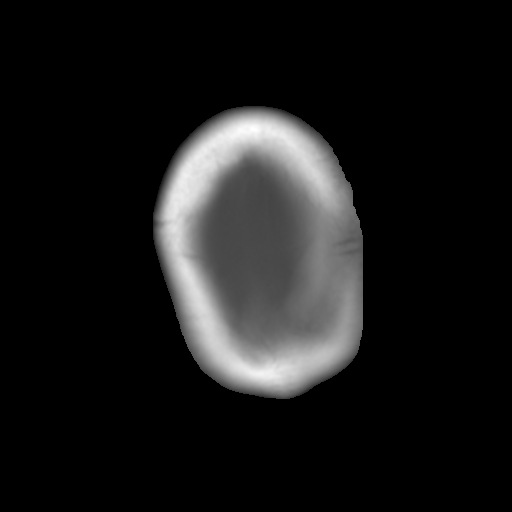

[Series 4: coronal soft · coronal · 0.32mm/px · 3 of 72 slices shown]
[im 24/72  brain]
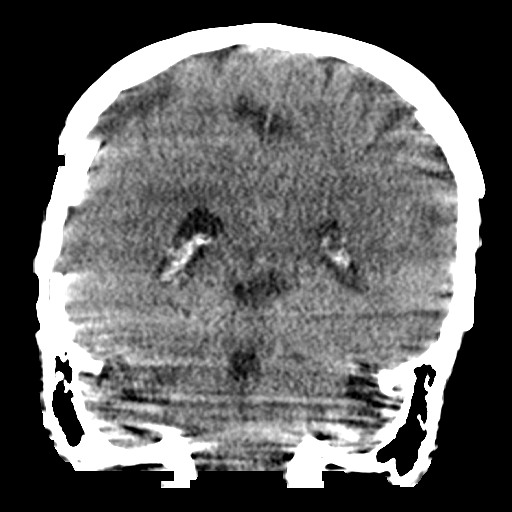
[im 32/72  brain]
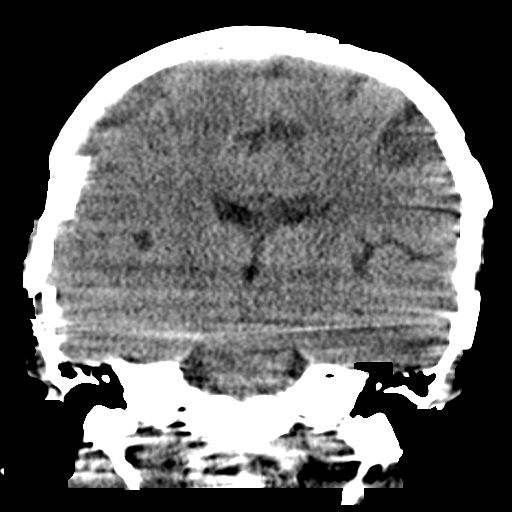
[im 40/72  brain]
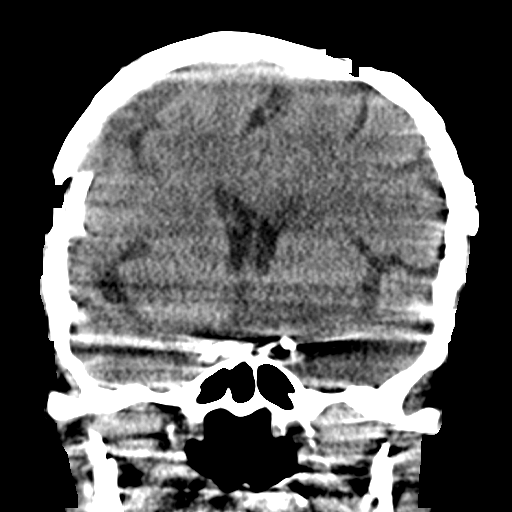

[Series 5: sagittal soft · sagittal · 0.32mm/px · 3 of 55 slices shown]
[im 19/55  brain]
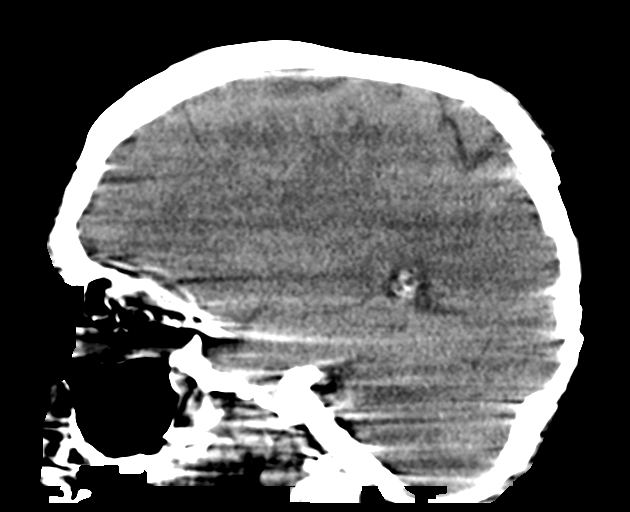
[im 28/55  brain]
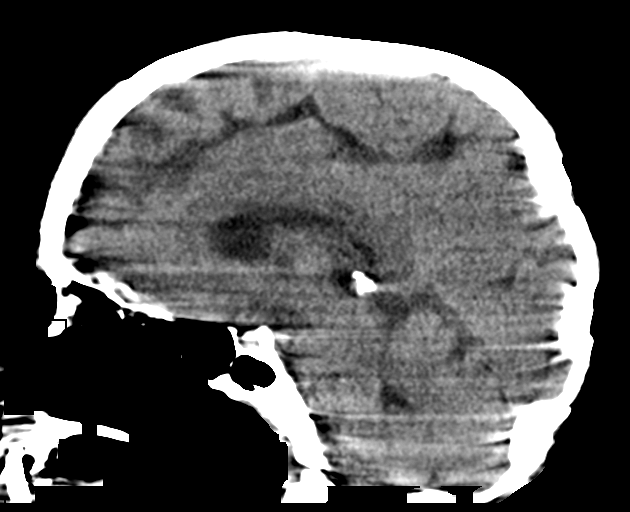
[im 37/55  brain]
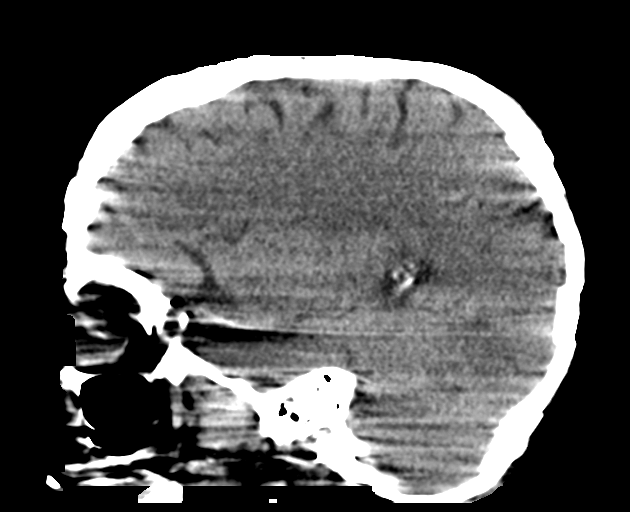

[15 of 47 positions shown; findings below may reference images not displayed]

FINDINGS: Brain: Extensive motion artifact limits the examination. No gross
intracranial mass or ventricular enlargement. Appearance of subdural
fluid collection on the right, measuring about 15 mm in thickness on
axial images with some mass effect on underlying brain parenchyma.
Appearance suggest subacute collection though assessment is limited
secondary to excessive patient motion.

Vascular: No hyperdense vessels.  No unexpected calcification

Skull: No gross fracture

Sinuses/Orbits: No acute finding.

Other: None
IMPRESSION: 1. Extensive motion artifact limits the examination.
2. Appearance of subdural fluid collection on the right, measuring
about 15 mm in thickness with some mass effect on underlying brain
parenchyma. Appearance suggests subacute collection though
assessment is limited secondary to excessive patient motion. No
obvious hyperdense blood but again very limited secondary to motion.
Consider repeat study when the patient is able to tolerate supine
position.
